# Patient Record
Sex: Female | Born: 1985 | Race: White | Hispanic: No | Marital: Married | Smoking: Former smoker
Health system: Southern US, Community
[De-identification: ages and names within clinical notes are randomized; demographics above are authoritative.]

## PROBLEM LIST (undated history)

## (undated) ENCOUNTER — Emergency Department (HOSPITAL_COMMUNITY): Disposition: A | Discharge status: Home / Self Care | Payer: Self-pay

## (undated) DIAGNOSIS — R87629 Unspecified abnormal cytological findings in specimens from vagina: Secondary | ICD-10-CM

## (undated) DIAGNOSIS — B999 Unspecified infectious disease: Secondary | ICD-10-CM

## (undated) DIAGNOSIS — Z789 Other specified health status: Secondary | ICD-10-CM

## (undated) HISTORY — PX: OTHER SURGICAL HISTORY: SHX169

## (undated) HISTORY — PX: NO PAST SURGERIES: SHX2092

## (undated) HISTORY — DX: Unspecified infectious disease: B99.9

## (undated) HISTORY — DX: Unspecified abnormal cytological findings in specimens from vagina: R87.629

---

## 2006-11-11 ENCOUNTER — Emergency Department (HOSPITAL_COMMUNITY): Admission: EM | Admit: 2006-11-11 | Discharge: 2006-11-11 | Payer: Self-pay | Admitting: Emergency Medicine

## 2009-04-28 ENCOUNTER — Emergency Department (HOSPITAL_COMMUNITY): Admission: EM | Admit: 2009-04-28 | Discharge: 2009-04-29 | Payer: Self-pay | Admitting: Emergency Medicine

## 2010-09-10 ENCOUNTER — Emergency Department (HOSPITAL_COMMUNITY): Admission: EM | Admit: 2010-09-10 | Discharge: 2010-09-10 | Payer: Self-pay | Admitting: Emergency Medicine

## 2011-10-01 ENCOUNTER — Inpatient Hospital Stay (HOSPITAL_COMMUNITY)
Admission: AD | Admit: 2011-10-01 | Discharge: 2011-10-01 | Disposition: A | Discharge status: Home / Self Care | Payer: Medicaid Other | Source: Ambulatory Visit | Attending: Family Medicine | Admitting: Family Medicine

## 2011-10-01 ENCOUNTER — Encounter (HOSPITAL_COMMUNITY): Payer: Self-pay

## 2011-10-01 DIAGNOSIS — N949 Unspecified condition associated with female genital organs and menstrual cycle: Secondary | ICD-10-CM | POA: Insufficient documentation

## 2011-10-01 DIAGNOSIS — A499 Bacterial infection, unspecified: Secondary | ICD-10-CM | POA: Insufficient documentation

## 2011-10-01 DIAGNOSIS — R109 Unspecified abdominal pain: Secondary | ICD-10-CM | POA: Insufficient documentation

## 2011-10-01 DIAGNOSIS — N760 Acute vaginitis: Secondary | ICD-10-CM

## 2011-10-01 DIAGNOSIS — N938 Other specified abnormal uterine and vaginal bleeding: Secondary | ICD-10-CM | POA: Insufficient documentation

## 2011-10-01 DIAGNOSIS — D696 Thrombocytopenia, unspecified: Secondary | ICD-10-CM | POA: Insufficient documentation

## 2011-10-01 DIAGNOSIS — B9689 Other specified bacterial agents as the cause of diseases classified elsewhere: Secondary | ICD-10-CM

## 2011-10-01 HISTORY — DX: Other specified health status: Z78.9

## 2011-10-01 LAB — URINALYSIS, ROUTINE W REFLEX MICROSCOPIC
Glucose, UA: NEGATIVE mg/dL
Ketones, ur: NEGATIVE mg/dL
Leukocytes, UA: NEGATIVE
Protein, ur: NEGATIVE mg/dL

## 2011-10-01 LAB — URINE MICROSCOPIC-ADD ON

## 2011-10-01 LAB — CBC
HCT: 41.7 % (ref 36.0–46.0)
MCHC: 32.1 g/dL (ref 30.0–36.0)
MCV: 89.1 fL (ref 78.0–100.0)
RDW: 13.5 % (ref 11.5–15.5)

## 2011-10-01 LAB — POCT PREGNANCY, URINE: Preg Test, Ur: NEGATIVE

## 2011-10-01 MED ORDER — NAPROXEN SODIUM 550 MG PO TABS: 550.0000 mg | ORAL_TABLET | Freq: Two times a day (BID) | ORAL | Status: DC

## 2011-10-01 MED ORDER — METRONIDAZOLE 500 MG PO TABS: 500.0000 mg | ORAL_TABLET | Freq: Two times a day (BID) | ORAL | Status: AC

## 2011-10-01 NOTE — ED Provider Notes (Signed)
Chart reviewed and agree with management and plan.  

## 2011-10-01 NOTE — ED Provider Notes (Signed)
History   Pt presents today c/o bilateral lower abd pain and cramping for the past week. She states she was on the Depo-provera injection for years but did not get her last injection in June of this year. Since that time she has had irregular vag spotting and pain. She reports the pain has worsened over the past week. Her last episode of intercourse was 2 days ago. She denies fever, vag irritation, or any other problems at this time.  Chief Complaint  Patient presents with  . Possible Pregnancy  . Abdominal Pain   HPI  OB History    Grav Para Term Preterm Abortions TAB SAB Ect Mult Living   0               History reviewed. No pertinent past medical history.  History reviewed. No pertinent past surgical history.  No family history on file.  History  Substance Use Topics  . Smoking status: Not on file  . Smokeless tobacco: Not on file  . Alcohol Use: Not on file    Allergies: Allergies not on file  No prescriptions prior to admission    Review of Systems  Constitutional: Negative for fever.  Cardiovascular: Negative for chest pain.  Gastrointestinal: Positive for abdominal pain. Negative for nausea, vomiting, diarrhea and constipation.  Genitourinary: Negative for dysuria, urgency, frequency and hematuria.  Neurological: Negative for dizziness and headaches.  Psychiatric/Behavioral: Negative for depression and suicidal ideas.   Physical Exam   Blood pressure 120/74, pulse 81, temperature 98.4 F (36.9 C), temperature source Oral, resp. rate 16, height 5\' 3"  (1.6 m), weight 89 lb 3.2 oz (40.461 kg), SpO2 98.00%.  Physical Exam  Nursing note and vitals reviewed. Constitutional: She is oriented to person, place, and time. She appears well-developed and well-nourished. No distress.  HENT:  Head: Normocephalic and atraumatic.  Eyes: EOM are normal. Pupils are equal, round, and reactive to light.  GI: Soft. She exhibits no distension. There is no tenderness. There is no  rebound and no guarding.  Genitourinary: There is bleeding around the vagina. Vaginal discharge found.       Cervix Lg/closed. Uterus NL size and shape. No adnexal masses.  Neurological: She is alert and oriented to person, place, and time.  Skin: Skin is warm and dry. She is not diaphoretic.  Psychiatric: She has a normal mood and affect. Her behavior is normal. Judgment and thought content normal.    MAU Course  Procedures  Wet prep and GC/Chlamydia cultures done.  Results for orders placed during the hospital encounter of 10/01/11 (from the past 24 hour(s))  URINALYSIS, ROUTINE W REFLEX MICROSCOPIC     Status: Abnormal   Collection Time   10/01/11  8:15 AM      Component Value Range   Color, Urine YELLOW  YELLOW    Appearance CLOUDY (*) CLEAR    Specific Gravity, Urine >1.030 (*) 1.005 - 1.030    pH 6.0  5.0 - 8.0    Glucose, UA NEGATIVE  NEGATIVE (mg/dL)   Hgb urine dipstick LARGE (*) NEGATIVE    Bilirubin Urine NEGATIVE  NEGATIVE    Ketones, ur NEGATIVE  NEGATIVE (mg/dL)   Protein, ur NEGATIVE  NEGATIVE (mg/dL)   Urobilinogen, UA 0.2  0.0 - 1.0 (mg/dL)   Nitrite NEGATIVE  NEGATIVE    Leukocytes, UA NEGATIVE  NEGATIVE   URINE MICROSCOPIC-ADD ON     Status: Abnormal   Collection Time   10/01/11  8:15 AM  Component Value Range   Squamous Epithelial / LPF MANY (*) RARE    RBC / HPF 7-10  <3 (RBC/hpf)   Bacteria, UA RARE  RARE   POCT PREGNANCY, URINE     Status: Normal   Collection Time   10/01/11  8:22 AM      Component Value Range   Preg Test, Ur NEGATIVE    CBC     Status: Abnormal   Collection Time   10/01/11  8:28 AM      Component Value Range   WBC 6.6  4.0 - 10.5 (K/uL)   RBC 4.68  3.87 - 5.11 (MIL/uL)   Hemoglobin 13.4  12.0 - 15.0 (g/dL)   HCT 96.0  45.4 - 09.8 (%)   MCV 89.1  78.0 - 100.0 (fL)   MCH 28.6  26.0 - 34.0 (pg)   MCHC 32.1  30.0 - 36.0 (g/dL)   RDW 11.9  14.7 - 82.9 (%)   Platelets 105 (*) 150 - 400 (K/uL)  WET PREP, GENITAL     Status:  Abnormal   Collection Time   10/01/11  8:32 AM      Component Value Range   Yeast, Wet Prep NONE SEEN  NONE SEEN    Trich, Wet Prep NONE SEEN  NONE SEEN    Clue Cells, Wet Prep MODERATE (*) NONE SEEN    WBC, Wet Prep HPF POC MODERATE (*) NONE SEEN      Assessment and Plan  BV: discussed with pt at length. Will tx with Flagyl. Warned of antabuse reaction.  Abd pain/irregular vag bleeding: advised pt to f/u with her PCP to start birth control. Will give Rx for anaprox DS. Discussed diet, activity, risks, and precautions.  Thrombocytopenia: discussed with pt at length. She needs to f/u with her PCP for further evaluation. Discussed importance of this at length. She understood and agreed.  Clinton Gallant. Sebastiana Wuest III, DrHSc, MPAS, PA-C  10/01/2011, 8:33 AM   Henrietta Hoover, PA 10/01/11 0909  Henrietta Hoover, PA 10/01/11 [phone]

## 2011-10-01 NOTE — Progress Notes (Signed)
Patient states she last had a Depo in March. Since that time she has had irregular bleeding. Started having abdominal pain about 1 1/2 weeks ago. Pain is in mid abdomen. Had a light pink discharge.

## 2011-10-02 LAB — GC/CHLAMYDIA PROBE AMP, GENITAL: Chlamydia, DNA Probe: NEGATIVE

## 2012-04-11 ENCOUNTER — Encounter (HOSPITAL_COMMUNITY): Payer: Self-pay

## 2012-04-11 ENCOUNTER — Emergency Department (HOSPITAL_COMMUNITY)
Admission: EM | Admit: 2012-04-11 | Discharge: 2012-04-11 | Disposition: A | Discharge status: Home / Self Care | Payer: Medicaid Other | Source: Home / Self Care | Attending: Family Medicine | Admitting: Family Medicine

## 2012-04-11 DIAGNOSIS — N949 Unspecified condition associated with female genital organs and menstrual cycle: Secondary | ICD-10-CM

## 2012-04-11 DIAGNOSIS — N938 Other specified abnormal uterine and vaginal bleeding: Secondary | ICD-10-CM

## 2012-04-11 DIAGNOSIS — N926 Irregular menstruation, unspecified: Secondary | ICD-10-CM

## 2012-04-11 MED ORDER — MEDROXYPROGESTERONE ACETATE 10 MG PO TABS: 10.0000 mg | ORAL_TABLET | Freq: Every day | ORAL | Status: DC

## 2012-04-11 NOTE — ED Provider Notes (Signed)
History     CSN: [id number]  Arrival date & time 04/11/12  1303   First MD Initiated Contact with Patient 04/11/12 1319      Chief Complaint  Patient presents with  . Medication Refill    (Consider location/radiation/quality/duration/timing/severity/associated sxs/prior treatment) Patient is a 26 y.o. female presenting with vaginal bleeding. The history is provided by the patient.  Vaginal Bleeding This is a chronic problem. The current episode started yesterday (stoppped depo provera last year and has menstrual problems since but controlled with provera). The problem has not changed since onset.Pertinent negatives include no abdominal pain.    Past Medical History  Diagnosis Date  . No pertinent past medical history     Past Surgical History  Procedure Date  . No past surgeries     History reviewed. No pertinent family history.  History  Substance Use Topics  . Smoking status: Current Everyday Smoker -- 0.2 packs/day  . Smokeless tobacco: Never Used  . Alcohol Use: No    OB History    Grav Para Term Preterm Abortions TAB SAB Ect Mult Living   0               Review of Systems  Constitutional: Negative.   Gastrointestinal: Negative.  Negative for abdominal pain.  Genitourinary: Positive for vaginal bleeding and menstrual problem. Negative for vaginal discharge and vaginal pain.    Allergies  Review of patient's allergies indicates no known allergies.  Home Medications   Current Outpatient Rx  Name Route Sig Dispense Refill  . MIDOL PO Oral Take 1 tablet by mouth daily as needed. Takes for pain     . MEDROXYPROGESTERONE ACETATE 10 MG PO TABS Oral Take 1 tablet (10 mg total) by mouth daily. After menses have continued 7 days 10 tablet 1  . MULTIVITAMINS PO CAPS Oral Take 1 capsule by mouth daily.      Marland Kitchen NAPROXEN SODIUM 550 MG PO TABS Oral Take 1 tablet (550 mg total) by mouth 2 (two) times daily with a meal. 30 tablet 0    BP 129/79  Pulse 84  Temp(Src)  98.8 F (37.1 C) (Oral)  Resp 18  SpO2 100%  LMP 04/10/2012  Physical Exam  Nursing note and vitals reviewed. Constitutional: She is oriented to person, place, and time. She appears well-developed and well-nourished.  Abdominal: Soft. Bowel sounds are normal. She exhibits no distension. There is no tenderness. There is no rebound and no guarding.  Neurological: She is alert and oriented to person, place, and time.  Skin: Skin is warm and dry.    ED Course  Procedures (including critical care time)   Labs Reviewed  POCT PREGNANCY, URINE   No results found.   1. DUB (dysfunctional uterine bleeding)       MDM          Linna Hoff, MD 04/11/12 1525

## 2012-04-11 NOTE — Discharge Instructions (Signed)
See your doctor if further problems. °

## 2012-04-11 NOTE — ED Notes (Signed)
Pt needs refill on provera, she states she started her period yesterday and has to take the provera after seven days to stop her period.

## 2012-08-15 ENCOUNTER — Inpatient Hospital Stay (HOSPITAL_COMMUNITY): Payer: Medicaid Other

## 2012-08-15 ENCOUNTER — Encounter (HOSPITAL_COMMUNITY): Payer: Self-pay | Admitting: *Deleted

## 2012-08-15 ENCOUNTER — Inpatient Hospital Stay (HOSPITAL_COMMUNITY)
Admission: AD | Admit: 2012-08-15 | Discharge: 2012-08-15 | Disposition: A | Discharge status: Home / Self Care | Payer: Medicaid Other | Source: Ambulatory Visit | Attending: Obstetrics & Gynecology | Admitting: Obstetrics & Gynecology

## 2012-08-15 DIAGNOSIS — B373 Candidiasis of vulva and vagina: Secondary | ICD-10-CM

## 2012-08-15 DIAGNOSIS — R109 Unspecified abdominal pain: Secondary | ICD-10-CM | POA: Insufficient documentation

## 2012-08-15 DIAGNOSIS — Z3490 Encounter for supervision of normal pregnancy, unspecified, unspecified trimester: Secondary | ICD-10-CM

## 2012-08-15 DIAGNOSIS — B3731 Acute candidiasis of vulva and vagina: Secondary | ICD-10-CM | POA: Insufficient documentation

## 2012-08-15 DIAGNOSIS — O2390 Unspecified genitourinary tract infection in pregnancy, unspecified trimester: Secondary | ICD-10-CM | POA: Insufficient documentation

## 2012-08-15 LAB — URINALYSIS, ROUTINE W REFLEX MICROSCOPIC
Bilirubin Urine: NEGATIVE
Hgb urine dipstick: NEGATIVE
Protein, ur: NEGATIVE mg/dL
Urobilinogen, UA: 0.2 mg/dL (ref 0.0–1.0)

## 2012-08-15 LAB — URINE MICROSCOPIC-ADD ON

## 2012-08-15 LAB — WET PREP, GENITAL: Trich, Wet Prep: NONE SEEN

## 2012-08-15 MED ORDER — MICONAZOLE NITRATE 2 % VA CREA: 1.0000 | TOPICAL_CREAM | Freq: Every day | VAGINAL | Status: DC

## 2012-08-15 MED ORDER — PRENATAL VITAMINS 28-0.8 MG PO TABS: 1.0000 | ORAL_TABLET | Freq: Every day | ORAL | Status: DC

## 2012-08-15 NOTE — MAU Provider Note (Signed)
Chief Complaint: Abdominal Pain and Shortness of Breath   First Provider Initiated Contact with Patient 08/15/12 0209     SUBJECTIVE HPI: Kathleen Romero is a 26 y.o. G1P0 at 10.4 weeks by unsure LMP who presents to maternity admissions reporting abdominal cramping and vaginal discharge.  She denies current SOB but indicates when she is having pain it is sometimes hard to catch her breath.  She has had 2 positive pregnancy tests at home and [facility]  Patient's last menstrual period was 05/31/2012.  She is unsure of this date.  She denies LOF, vaginal bleeding, vaginal itching/burning, urinary symptoms, h/a, dizziness, n/v, or fever/chills.     Past Medical History  Diagnosis Date  . No pertinent past medical history    Past Surgical History  Procedure Date  . No past surgeries    History   Social History  . Marital Status: Divorced    Spouse Name: N/A    Number of Children: N/A  . Years of Education: N/A   Occupational History  . Not on file.   Social History Main Topics  . Smoking status: Former Smoker -- 0.2 packs/day  . Smokeless tobacco: Never Used  . Alcohol Use: No  . Drug Use: No  . Sexually Active: Yes    Birth Control/ Protection: None   Other Topics Concern  . Not on file   Social History Narrative  . No narrative on file   No current facility-administered medications on file prior to encounter.   Current Outpatient Prescriptions on File Prior to Encounter  Medication Sig Dispense Refill  . Ibuprofen (MIDOL PO) Take 1 tablet by mouth daily as needed. Takes for pain      . medroxyPROGESTERone (PROVERA) 10 MG tablet Take 1 tablet (10 mg total) by mouth daily. After menses have continued 7 days  10 tablet  1  . Multiple Vitamin (MULTIVITAMIN) capsule Take 1 capsule by mouth daily.        . naproxen sodium (ANAPROX DS) 550 MG tablet Take 1 tablet (550 mg total) by mouth 2 (two) times daily with a meal.  30 tablet  0   No Known  Allergies  ROS: Pertinent items in HPI  OBJECTIVE Blood pressure 114/75, pulse 72, temperature 98.2 F (36.8 C), resp. rate 18, height 5\' 2"  (1.575 m), weight 42.094 kg (92 lb 12.8 oz), last menstrual period 05/31/2012, SpO2 100.00%. GENERAL: Well-developed, well-nourished female in no acute distress.  HEENT: Normocephalic HEART: normal rate RESP: normal effort, lung sounds clear bilaterally in all lobes  ABDOMEN: Soft, non-tender EXTREMITIES: Nontender, no edema NEURO: Alert and oriented Pelvic exam: Cervix pink, visually closed, without lesion, large amount white thick discharge with clumps adhering to vaginal walls and some thin yellow discharge in posterior fornix, vaginal walls and external genitalia normal Bimanual exam: Cervix 0/long/high, firm, posterior, neg CMT, uterus nontender, ~8 week size, adnexa without tenderness, enlargement, or mass  LAB RESULTS Results for orders placed during the hospital encounter of 08/15/12 (from the past 24 hour(s))  URINALYSIS, ROUTINE W REFLEX MICROSCOPIC     Status: Abnormal   Collection Time   08/15/12 12:35 AM      Component Value Range   Color, Urine YELLOW  YELLOW   APPearance CLEAR  CLEAR   Specific Gravity, Urine 1.015  1.005 - 1.030   pH 7.0  5.0 - 8.0   Glucose, UA NEGATIVE  NEGATIVE mg/dL   Hgb urine dipstick NEGATIVE  NEGATIVE   Bilirubin  Urine NEGATIVE  NEGATIVE   Ketones, ur NEGATIVE  NEGATIVE mg/dL   Protein, ur NEGATIVE  NEGATIVE mg/dL   Urobilinogen, UA 0.2  0.0 - 1.0 mg/dL   Nitrite NEGATIVE  NEGATIVE   Leukocytes, UA MODERATE (*) NEGATIVE  URINE MICROSCOPIC-ADD ON     Status: Abnormal   Collection Time   08/15/12 12:35 AM      Component Value Range   Squamous Epithelial / LPF FEW (*) RARE   WBC, UA 3-6  <3 WBC/hpf   RBC / HPF 0-2  <3 RBC/hpf   Bacteria, UA FEW (*) RARE  POCT PREGNANCY, URINE     Status: Abnormal   Collection Time   08/15/12 12:40 AM      Component Value Range   Preg Test, Ur POSITIVE (*) NEGATIVE   WET PREP, GENITAL     Status: Abnormal   Collection Time   08/15/12  2:07 AM      Component Value Range   Yeast Wet Prep HPF POC FEW (*) NONE SEEN   Trich, Wet Prep NONE SEEN  NONE SEEN   Clue Cells Wet Prep HPF POC MANY (*) NONE SEEN   WBC, Wet Prep HPF POC MANY (*) NONE SEEN    IMAGING US Ob Comp Less 14 Wks  08/15/2012  *RADIOLOGY REPORT*  Clinical Data: First trimester pregnancy with pain.  OBSTETRIC <14 WK ULTRASOUND  Technique:  Transabdominal ultrasound was performed for evaluation of the gestation as well as the maternal uterus and adnexal regions.  Comparison:  None.  Intrauterine gestational sac: Visualized/normal in shape. Yolk sac: Present Embryo: Present Cardiac Activity: Present Heart Rate: 170 bpm CRL:  21 mm  8 w  5 d         [place] EDC: 03/22/2013  Maternal uterus/Adnexae: Normal appearance of left ovary measuring 4.1 x 2.4 x 2.4 cm. Normal appearance of the right ovary measuring 3.4 x 2.0 x 2.5 cm  IMPRESSION: Live single intrauterine pregnancy.  Gestational age by first trimester ultrasound is 8 weeks and 5 days.   Original Report Authenticated By: Richarda Overlie, M.D.     ASSESSMENT 1. Normal IUP (intrauterine pregnancy) on prenatal ultrasound   2. Vaginal candida    IUP [redacted]w[redacted]d by U/S  PLAN Discharge home F/U with early prenatal care Pregnancy verification letter and list of providers given Prenatal vitamin and Monistat 7 prescribed Return to MAU as needed    Medication List     As of 08/15/2012  8:48 AM    STOP taking these medications         medroxyPROGESTERone 10 MG tablet   Commonly known as: PROVERA      MIDOL PO      multivitamin capsule      naproxen sodium 550 MG tablet   Commonly known as: ANAPROX      TAKE these medications         miconazole 2 % vaginal cream   Commonly known as: MONISTAT 7   Place 7 Applicatorfuls vaginally at bedtime.      Prenatal Vitamins 28-0.8 MG Tabs   Take 1 tablet by mouth daily.           Sharen Counter Certified Nurse-Midwife 08/15/2012  2:44 AM

## 2012-08-15 NOTE — MAU Note (Signed)
Been having some pain in mid L abdomen. Kathleen Romero and comes and goes. Sometimes when pain hits I have alittle shorteness of breath.

## 2012-08-15 NOTE — Progress Notes (Signed)
Sharen Counter CNM in earlier and discussed lab results and d/c plan with pt. Written and verbal d/c instructions given and understanding voiced.

## 2012-08-16 LAB — URINE CULTURE: Colony Count: NO GROWTH

## 2012-08-17 LAB — GC/CHLAMYDIA PROBE AMP, GENITAL: GC Probe Amp, Genital: NEGATIVE

## 2012-09-01 ENCOUNTER — Ambulatory Visit (INDEPENDENT_AMBULATORY_CARE_PROVIDER_SITE_OTHER): Payer: Self-pay | Admitting: Obstetrics and Gynecology

## 2012-09-01 DIAGNOSIS — Z331 Pregnant state, incidental: Secondary | ICD-10-CM

## 2012-09-02 DIAGNOSIS — Z331 Pregnant state, incidental: Secondary | ICD-10-CM | POA: Insufficient documentation

## 2012-09-02 LAB — PRENATAL PANEL VII
Basophils Relative: 0 % (ref 0–1)
Eosinophils Absolute: 0.2 10*3/uL (ref 0.0–0.7)
HCT: 34 % — ABNORMAL LOW (ref 36.0–46.0)
Hemoglobin: 11.2 g/dL — ABNORMAL LOW (ref 12.0–15.0)
MCH: 28.2 pg (ref 26.0–34.0)
MCHC: 32.9 g/dL (ref 30.0–36.0)
MCV: 85.6 fL (ref 78.0–100.0)
Monocytes Absolute: 0.7 10*3/uL (ref 0.1–1.0)
Monocytes Relative: 7 % (ref 3–12)
Rh Type: POSITIVE
Rubella: 51.9 IU/mL — ABNORMAL HIGH

## 2012-09-03 LAB — CULTURE, OB URINE
Colony Count: NO GROWTH
Organism ID, Bacteria: NO GROWTH

## 2012-09-07 ENCOUNTER — Encounter: Payer: Self-pay | Admitting: Obstetrics and Gynecology

## 2012-09-07 ENCOUNTER — Ambulatory Visit (INDEPENDENT_AMBULATORY_CARE_PROVIDER_SITE_OTHER): Payer: Self-pay | Admitting: Obstetrics and Gynecology

## 2012-09-07 VITALS — BP 98/62 | Wt 95.0 lb

## 2012-09-07 DIAGNOSIS — Z331 Pregnant state, incidental: Secondary | ICD-10-CM

## 2012-09-07 DIAGNOSIS — Z8279 Family history of other congenital malformations, deformations and chromosomal abnormalities: Secondary | ICD-10-CM

## 2012-09-07 NOTE — Progress Notes (Signed)
NOB work-up  Pt stated been having a light brown discharge. Pt stated no issues today.

## 2012-09-08 ENCOUNTER — Telehealth: Payer: Self-pay | Admitting: Obstetrics and Gynecology

## 2012-09-08 DIAGNOSIS — Z8279 Family history of other congenital malformations, deformations and chromosomal abnormalities: Secondary | ICD-10-CM | POA: Insufficient documentation

## 2012-09-08 NOTE — Progress Notes (Signed)
Patient ID: Tonika Eden, female   DOB: 11/04/1986, 26 y.o.   MRN: [id number] Gwynneth Fabio is a 26 y.o. female presenting for new ob visit. Not on Birth control at time of conception, dating by 8 5/. Week [place] on 9/26 for [facility] 03/22/2013 discussed. Without N,V, is tolerating PNV. @MED  @IPILAPH @ OB History    Grav Para Term Preterm Abortions TAB SAB Ect Mult Living   1             SVD uncomplicated Past Medical History  Diagnosis Date  . No pertinent past medical history   . Infection     Yeast;was treated 08/15/12 w/ Monistat 7  . Infection     BV x 2   Past Surgical History  Procedure Date  . No past surgeries    Family History: family history includes Alcohol abuse in her father; Asthma in her paternal uncle; Hypertension in her father and mother; and Other in her other. Social History:  reports that she quit smoking about 7 weeks ago. She has never used smokeless tobacco. She reports that she does not drink alcohol or use illicit drugs. Father of baby with congenital heart defect Fm hx of Huntington Clorea MGF, M uncle, M aunt @ROS @    Blood pressure 98/62, weight 95 lb (43.092 kg), last menstrual period 05/31/2012. Physical exam: Calm, no distress, HEENT wnl lungs clear bilaterally, breasts bilaterally no masses, dimpling, or drainage, AP RRR, abd soft, gravid, nt, bowel sounds active, abdomen nontender,  Normal hair distrubition mons pubis,  EGBUS WNL, sterile speculum exam,  vagina pink, moist normal rugae,  cerix LTC, no cervical motion tenderness, No adnexal masses or tenderness Uterus size 12 Scant white discharge DTR +1 no clonus No edema to lower extremities  Prenatal labs: ABO, Rh: O/POS/-- (10/15 1404) Antibody: NEG (10/15 1404) Rubella:  Immune RPR: NON REAC (10/15 1404)  HBsAg: NEGATIVE (10/15 1404)  HIV: NON REACTIVE (10/15 1404)    Assessment/Plan: 12w FOB with congenital heart defect GC/CHL neg/neg WET PREP neg PAP less than 1 year ago with  request records ULTRASOUND plan 20 week anatomy Genetic testing (with fm hx of Huntington Clorea [facility] or amnocentesis testing declines) but  plans QUAD Collaboration with Dr. Estanislado Pandy. [facility], Kashayla Ungerer 09/08/2012, 3:41 PM Lavera Guise, CNM

## 2012-09-08 NOTE — Telephone Encounter (Signed)
Left message to call office with information regarding father of baby congenital heart defect Kathleen Romero, CNM

## 2012-09-09 ENCOUNTER — Encounter: Payer: Self-pay | Admitting: Obstetrics and Gynecology

## 2012-09-09 DIAGNOSIS — Z820 Family history of epilepsy and other diseases of the nervous system: Secondary | ICD-10-CM | POA: Insufficient documentation

## 2012-09-22 ENCOUNTER — Telehealth: Payer: Self-pay | Admitting: Obstetrics and Gynecology

## 2012-09-22 ENCOUNTER — Other Ambulatory Visit: Payer: Self-pay | Admitting: Obstetrics and Gynecology
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
## 2012-10-03 ENCOUNTER — Encounter: Payer: Self-pay | Admitting: Obstetrics and Gynecology

## 2012-10-03 DIAGNOSIS — IMO0002 Reserved for concepts with insufficient information to code with codable children: Secondary | ICD-10-CM | POA: Insufficient documentation

## 2012-10-05 ENCOUNTER — Ambulatory Visit (INDEPENDENT_AMBULATORY_CARE_PROVIDER_SITE_OTHER): Payer: Medicaid Other | Admitting: Obstetrics and Gynecology

## 2012-10-05 VITALS — BP 104/56 | Wt 98.0 lb

## 2012-10-05 DIAGNOSIS — Z331 Pregnant state, incidental: Secondary | ICD-10-CM

## 2012-10-05 DIAGNOSIS — Z3689 Encounter for other specified antenatal screening: Secondary | ICD-10-CM

## 2012-10-05 DIAGNOSIS — Z23 Encounter for immunization: Secondary | ICD-10-CM

## 2012-10-05 NOTE — Progress Notes (Signed)
[redacted]w[redacted]d.  Flu vaccine. Pt voices no complaints today.  Prenatal labs reviewed QUAD SCREEN TODAY Anatomy U/S [place] Long hx of thrombocytopenia and easy bleeding earlier in life.  Hematology consult. Check at [place] to see if pap result with date has been sent from [facility]

## 2012-10-06 ENCOUNTER — Other Ambulatory Visit: Payer: Self-pay

## 2012-10-06 DIAGNOSIS — Z862 Personal history of diseases of the blood and blood-forming organs and certain disorders involving the immune mechanism: Secondary | ICD-10-CM

## 2012-10-06 LAB — AFP, QUAD SCREEN
Age Alone: 1:1030 {titer}
Curr Gest Age: 16 wks.days
INH: 230.6 pg/mL
MoM for AFP: 1.3
Osb Risk: 1:4990 {titer}
Trisomy 18 (Edward) Syndrome Interp.: <1:86500 {titer}
uE3 Value: 0.8 ng/mL

## 2012-10-07 ENCOUNTER — Telehealth: Payer: Self-pay | Admitting: Hematology and Oncology

## 2012-10-07 NOTE — Telephone Encounter (Signed)
C/D 10/07/12 for appt.10/22/12

## 2012-10-07 NOTE — Telephone Encounter (Signed)
S/W pt in NP appt 12/05 @ 9:30 w/Dr. Dalene Carrow.  Referring Dr. Pennie Rushing Dx-H/O Thrombocytopenia.  Welcome packet mailed.

## 2012-10-22 ENCOUNTER — Ambulatory Visit (HOSPITAL_BASED_OUTPATIENT_CLINIC_OR_DEPARTMENT_OTHER): Payer: Medicaid Other | Admitting: Hematology and Oncology

## 2012-10-22 ENCOUNTER — Encounter: Payer: Self-pay | Admitting: Hematology and Oncology

## 2012-10-22 ENCOUNTER — Ambulatory Visit (HOSPITAL_BASED_OUTPATIENT_CLINIC_OR_DEPARTMENT_OTHER): Payer: Medicaid Other | Admitting: Lab

## 2012-10-22 ENCOUNTER — Ambulatory Visit: Payer: Medicaid Other

## 2012-10-22 ENCOUNTER — Telehealth: Payer: Self-pay | Admitting: Hematology and Oncology

## 2012-10-22 VITALS — BP 121/66 | HR 104 | Temp 98.6°F | Resp 20 | Ht 62.0 in | Wt 101.1 lb

## 2012-10-22 DIAGNOSIS — D689 Coagulation defect, unspecified: Secondary | ICD-10-CM

## 2012-10-22 DIAGNOSIS — D696 Thrombocytopenia, unspecified: Secondary | ICD-10-CM

## 2012-10-22 DIAGNOSIS — O99119 Other diseases of the blood and blood-forming organs and certain disorders involving the immune mechanism complicating pregnancy, unspecified trimester: Secondary | ICD-10-CM

## 2012-10-22 DIAGNOSIS — R58 Hemorrhage, not elsewhere classified: Secondary | ICD-10-CM

## 2012-10-22 LAB — CBC WITH DIFFERENTIAL/PLATELET
BASO%: 0.2 % (ref 0.0–2.0)
Eosinophils Absolute: 0.2 10*3/uL (ref 0.0–0.5)
LYMPH%: 14.7 % (ref 14.0–49.7)
MCHC: 33.7 g/dL (ref 31.5–36.0)
MONO#: 0.9 10*3/uL (ref 0.1–0.9)
NEUT#: 7.6 10*3/uL — ABNORMAL HIGH (ref 1.5–6.5)
Platelets: 134 10*3/uL — ABNORMAL LOW (ref 145–400)
RBC: 3.91 10*6/uL (ref 3.70–5.45)
WBC: 10.2 10*3/uL (ref 3.9–10.3)
lymph#: 1.5 10*3/uL (ref 0.9–3.3)

## 2012-10-22 LAB — COMPREHENSIVE METABOLIC PANEL (CC13)
ALT: 53 U/L (ref 0–55)
Albumin: 3 g/dL — ABNORMAL LOW (ref 3.5–5.0)
CO2: 26 mEq/L (ref 22–29)
Calcium: 8.7 mg/dL (ref 8.4–10.4)
Chloride: 108 mEq/L — ABNORMAL HIGH (ref 98–107)
Glucose: 59 mg/dl — ABNORMAL LOW (ref 70–99)
Potassium: 3.8 mEq/L (ref 3.5–5.1)
Sodium: 139 mEq/L (ref 136–145)
Total Bilirubin: 0.23 mg/dL (ref 0.20–1.20)
Total Protein: 6.1 g/dL — ABNORMAL LOW (ref 6.4–8.3)

## 2012-10-22 NOTE — Patient Instructions (Addendum)
Everitt Amber  [id number]   [facility] - AFTER VISIT SUMMARY   **RECOMMENDATIONS MADE BY THE CONSULTANT AND ANY TEST    RESULTS WILL BE SENT TO YOUR REFERRING DOCTORS.   YOUR EXAM FINDINGS, LABS AND RESULTS WERE DISCUSSED BY YOUR MD TODAY.  YOU CAN GO TO THE [facility] WEB SITE FOR INSTRUCTIONS ON HOW TO ASSESS MY CHART FOR ADDITIONAL INFORMATION AS NEEDED.  Your Updated drug allergies are: Allergies as of 10/22/2012  . (No Known Allergies)    Your current list of medications are: Current Outpatient Prescriptions  Medication Sig Dispense Refill  . Prenatal Vit-Fe Fumarate-FA (PRENATAL VITAMINS) 28-0.8 MG TABS Take 2 tablets by mouth daily.         INSTRUCTIONS GIVEN AND DISCUSSED:  See attached schedule   SPECIAL INSTRUCTIONS/FOLLOW-UP:  See above.  I acknowledge that I have been informed and understand all the instructions given to me and received a copy.I know to contact the clinic, my physician, or go to the emergency Department if any problems should occur.   I do not have any more questions at this time, but understand that I may call the [facility] at [phone] during business hours should I have any further questions or need assistance in obtaining follow-up care.

## 2012-10-22 NOTE — Telephone Encounter (Signed)
appts made and printed for pt aom °

## 2012-10-22 NOTE — Progress Notes (Signed)
NO   Primary. Goes  To   [facility]   OB/GYN.  [facility]    Pharmacy   On   [address] / [address] [facility]    Pharmacy   On   [address]  Dr. Rushie Chestnut    Pharmacy   On   [address] / [address]  Cell   Phone     [phone].

## 2012-10-22 NOTE — Progress Notes (Signed)
This office note has been dictated.

## 2012-10-23 NOTE — Progress Notes (Signed)
CC:   Hal Morales, M.D.  IDENTIFYING STATEMENT:  The patient is a 26 year old woman seen at request of Dr. Pennie Rushing with thrombocytopenia.  HISTORY OF PRESENT ILLNESS:  The patient is [redacted] weeks pregnant.  This is her first pregnancy.  She is here because she has a history for thrombocytopenia.  She reports that she has been thrombocytopenic for most of her life.  Her father also has thrombocytopenia.  He was never treated but passed away from "unknown illness".  The patient reports that she bruises quite easily.  She also reports that she is a "bleeder".  She has had a number of nose bleeds when younger.  She also gives a history of heavy periods.  She is not double jointed.  Since being pregnant she notes that bruising has lessened.  Review of blood results have noted the following:  On 09/01/2010 white cell count 9.8, hemoglobin 11.2, hematocrit 34, platelets 135; a year ago white cell count 6.6, hemoglobin 13.4, hematocrit 41.7, platelets 105.  PAST MEDICAL HISTORY:  Nil.  SOCIAL HISTORY:  The patient is single.  This is her first child.  She reports she has smoked a quarter pack per day for 12 years.  She denies alcohol use.  She is a Administrator at [employer].  FAMILY HISTORY:  She reports her father as having thrombocytopenia. There is also family history for Huntington's chorea in a maternal grandfather, maternal uncle and maternal aunt.  MEDICATIONS:  Prenatal vitamins.  ALLERGIES:  None.  REVIEW OF SYSTEMS:  A 15 point review of systems essentially negative except for notes some bruising.  PHYSICAL EXAM:  General:  The patient is alert and oriented x3.  Vitals: Pulse 104, blood pressure 121/66, temperature 98.6, respirations 20, weight 101 pounds.  HEENT:  Head is atraumatic, normocephalic.  Sclerae anicteric.  Mouth moist.  Chest:  Clear to percussion and auscultation. CVS:  First and second heart sounds present.  No added sounds or murmurs.   Abdomen:  Soft, nontender.  No masses.  Bowel sounds present. Extremities:  No edema.  Pulses present.  IMPRESSION AND PLAN:  [name] is a 26 year old woman with thrombocytopenia who is [redacted] weeks pregnant.  The patient has family history for it and reports history of easy bruising and menorrhagia prior to her pregnancy.  Her previous blood counts a month and a half ago reports a platelet count of 135 which is really within the low limits of normal.  I will have her return to the lab to repeat a CBC. If her counts are lower than 80,000 we may give some steroids but before we do that I would like for her to have a von Willebrand's panel performed.  Will review morphology.  Will check vitamin B12 level, thyroid function tests and platelet functional studies.  We spent more than half the time discussing implications of thrombocytopenia with pregnancy specifically at time of delivery. She had a number of questions that were all answered.  As soon as we get lab results she will follow up and additional recommendations will follow.    ______________________________ Laurice Record, M.D. LIO/MEDQ  D:  10/22/2012  T:  10/23/2012  Job:  [id number]

## 2012-10-27 LAB — VON WILLEBRAND FACTOR MULTIMER
Factor-VIII Activity: 142 % (ref 50–180)
Von Willebrand Factor Ag: 102 % (ref 50–217)

## 2012-10-27 LAB — VITAMIN B12: Vitamin B-12: 537 pg/mL (ref 211–911)

## 2012-11-02 ENCOUNTER — Encounter: Payer: Medicaid Other | Admitting: Obstetrics and Gynecology

## 2012-11-02 ENCOUNTER — Other Ambulatory Visit: Payer: Medicaid Other

## 2012-11-03 ENCOUNTER — Telehealth: Payer: Self-pay | Admitting: Obstetrics and Gynecology

## 2012-11-04 ENCOUNTER — Encounter: Payer: Self-pay | Admitting: Obstetrics and Gynecology

## 2012-11-04 ENCOUNTER — Ambulatory Visit (INDEPENDENT_AMBULATORY_CARE_PROVIDER_SITE_OTHER): Payer: Medicaid Other

## 2012-11-04 ENCOUNTER — Ambulatory Visit (INDEPENDENT_AMBULATORY_CARE_PROVIDER_SITE_OTHER): Payer: Medicaid Other | Admitting: Obstetrics and Gynecology

## 2012-11-04 VITALS — BP 96/60 | Wt 107.0 lb

## 2012-11-04 DIAGNOSIS — Z3689 Encounter for other specified antenatal screening: Secondary | ICD-10-CM

## 2012-11-04 DIAGNOSIS — Z331 Pregnant state, incidental: Secondary | ICD-10-CM

## 2012-11-04 NOTE — Progress Notes (Signed)
[redacted]w[redacted]d Ultrasound: Single gestation, normal anatomy, female, normal fluid, cervix 3.1 cm, 354 g (53rd percentile). Quad screen normal Return to office in 4 weeks. Dr. Stefano Gaul

## 2012-11-04 NOTE — Progress Notes (Signed)
[redacted]w[redacted]d PT HERE TO F/U ANATOMY SCAN. NO PROBLEMS.

## 2012-11-06 LAB — US OB COMP + 14 WK

## 2012-11-07 ENCOUNTER — Telehealth: Payer: Self-pay | Admitting: Oncology

## 2012-11-07 NOTE — Telephone Encounter (Signed)
lm to call regarding r/s appt(went ahead and r/s appt to [facility] 11/30/12)    anne

## 2012-11-18 NOTE — L&D Delivery Note (Signed)
Delivery Note At 4:18 PM a viable female was delivered via Vaginal, Spontaneous Delivery (Presentation: Right Occiput Anterior).  APGAR: 9, 9; weight pending.   Placenta status: Intact, Spontaneous 3 VC. Cord pH: not collected  Complications: After delivery I was called to [facility] for heavy bleeding.  Initial assessment: fundus firm with continuous trickle.  Dr. Normand Sloop called to come in.  Speculum exam done to assess for lacs. PPH protocol initiated.  Pitocin bolus continued and 0.2 mg Methergine IM given.  Dr. Normand Sloop at [facility] - see her note for further details.  Anesthesia: Epidural  Episiotomy: None Lacerations: Left and right labial lac hemodynamically stable no repair Suture Repair: n/a Est. Blood Loss (mL): 300  Total blood loss after PPH 1500 mL  Mom  stable, will reassess bleeding before admiting to [facility].  Baby in room with mom and pt's mom.  Haroldine Laws 03/06/2013, 5:26 PM

## 2012-11-21 ENCOUNTER — Telehealth: Payer: Self-pay | Admitting: Oncology

## 2012-11-21 ENCOUNTER — Encounter: Payer: Self-pay | Admitting: Oncology

## 2012-11-21 NOTE — Telephone Encounter (Signed)
Former pt of LO reassigned to DM. Still not able to reach pt. Letter will be sent.

## 2012-11-24 ENCOUNTER — Telehealth: Payer: Self-pay | Admitting: Obstetrics and Gynecology

## 2012-11-24 NOTE — Telephone Encounter (Signed)
Tc to pt per telephone call. Informed pt may try otc plain Sudafed. Denies fever. Pt voices understanding.

## 2012-11-30 ENCOUNTER — Ambulatory Visit: Payer: Medicaid Other | Admitting: Family

## 2012-12-01 ENCOUNTER — Ambulatory Visit: Payer: Medicaid Other | Admitting: Hematology and Oncology

## 2012-12-02 ENCOUNTER — Encounter: Payer: Medicaid Other | Admitting: Obstetrics and Gynecology

## 2012-12-07 ENCOUNTER — Encounter: Payer: Self-pay | Admitting: Obstetrics and Gynecology

## 2012-12-07 ENCOUNTER — Ambulatory Visit: Payer: Medicaid Other | Admitting: Obstetrics and Gynecology

## 2012-12-07 VITALS — BP 100/62 | Wt 113.0 lb

## 2012-12-07 DIAGNOSIS — Z331 Pregnant state, incidental: Secondary | ICD-10-CM

## 2012-12-07 NOTE — Progress Notes (Signed)
[redacted]w[redacted]d Scheduled fetal echo glc @[facility] 

## 2012-12-11 ENCOUNTER — Ambulatory Visit (HOSPITAL_BASED_OUTPATIENT_CLINIC_OR_DEPARTMENT_OTHER): Payer: Medicaid Other | Admitting: Lab

## 2012-12-11 ENCOUNTER — Telehealth: Payer: Self-pay | Admitting: Oncology

## 2012-12-11 ENCOUNTER — Encounter: Payer: Self-pay | Admitting: Family

## 2012-12-11 ENCOUNTER — Ambulatory Visit (HOSPITAL_BASED_OUTPATIENT_CLINIC_OR_DEPARTMENT_OTHER): Payer: Medicaid Other | Admitting: Family

## 2012-12-11 VITALS — BP 123/69 | HR 102 | Temp 98.2°F | Resp 18 | Ht 62.0 in | Wt 113.0 lb

## 2012-12-11 DIAGNOSIS — D689 Coagulation defect, unspecified: Secondary | ICD-10-CM

## 2012-12-11 DIAGNOSIS — D696 Thrombocytopenia, unspecified: Secondary | ICD-10-CM

## 2012-12-11 LAB — CBC WITH DIFFERENTIAL/PLATELET
BASO%: 0.2 % (ref 0.0–2.0)
Basophils Absolute: 0 10*3/uL (ref 0.0–0.1)
EOS%: 2.1 % (ref 0.0–7.0)
Eosinophils Absolute: 0.3 10*3/uL (ref 0.0–0.5)
HCT: 33.4 % — ABNORMAL LOW (ref 34.8–46.6)
HGB: 11.4 g/dL — ABNORMAL LOW (ref 11.6–15.9)
LYMPH%: 15 % (ref 14.0–49.7)
MCH: 30 pg (ref 25.1–34.0)
MCHC: 34.1 g/dL (ref 31.5–36.0)
MCV: 87.9 fL (ref 79.5–101.0)
MONO#: 1.2 10*3/uL — ABNORMAL HIGH (ref 0.1–0.9)
MONO%: 9 % (ref 0.0–14.0)
NEUT#: 9.6 10*3/uL — ABNORMAL HIGH (ref 1.5–6.5)
NEUT%: 73.7 % (ref 38.4–76.8)
Platelets: 127 10*3/uL — ABNORMAL LOW (ref 145–400)
RBC: 3.8 10*6/uL (ref 3.70–5.45)
RDW: 14.3 % (ref 11.2–14.5)
WBC: 13 10*3/uL — ABNORMAL HIGH (ref 3.9–10.3)
lymph#: 1.9 10*3/uL (ref 0.9–3.3)

## 2012-12-11 NOTE — Telephone Encounter (Signed)
gv and printed appt schedule for Feb and March ....the patient aware °

## 2012-12-11 NOTE — Patient Instructions (Addendum)
Please contact us at [phone] if you have any questions or concerns.  Call us if you experience any excessive bleeding.

## 2012-12-11 NOTE — Progress Notes (Signed)
Patient ID: Kathleen Romero, female   DOB: [birth year], 27 y.o.   MRN: [id number] CSN: [id number]  CC: Hal Morales, M.D.  Problem List: Kathleen Romero is a 28 y.o. Caucasian female with a problem list consisting of:  1.  Thrombocytopenia with low platelets documented in EPIC on 10/01/2011 at 105,000. The patient states that she has a history of bleeding/bruising easily, epistaxis and menorrhagia.  Normal Von Willebrand on 2012/10/23.  Her father (deceased) had a history of thrombocytopenia.  Family history of Huntington's Chorea. 2.  Pregnant with her first pregnancy with an expected due date of 03/22/2013.  Dr. Arline Asp and I saw Kathleen Romero today for follow up of thrombocytopenia.   Kathleen Romero is transitioning her care from Dr. Dalene Carrow to Dr. Arline Asp today. Kathleen Romero was last seen by Dr. Dalene Carrow on 2012/10/23. Since her last office visit, Kathleen Romero states that she been doing well and does not have any complaints today. She denies any symptomatology during today's visit including any unusual bleeding, bruising, night sweats, fever, chills, N/V/D, headaches or constipation. Kathleen Romero is physically active and has a good appetite. She works part-time at [employer].  Past Medical History: Past Medical History  Diagnosis Date  . No pertinent past medical history   . Infection     Yeast;was treated 08/15/12 w/ Monistat 7  . Infection     BV x 2    Surgical History: Past Surgical History  Procedure Date  . No past surgeries     Current Medications: Current Outpatient Prescriptions  Medication Sig Dispense Refill  . Prenatal Vit-Fe Fumarate-FA (PRENATAL VITAMINS) 28-0.8 MG TABS Take 2 tablets by mouth daily.        Allergies: No Known Allergies  Family History: Family History  Problem Relation Age of Onset  . Hypertension Mother   . Hypertension Father     deceased  . Other Other     Huntington's Chorea;MGF, M. Uncle(deceased);M. Aunt (showing signs of)  .  Asthma Paternal Uncle     x 2  . Alcohol abuse Father     Social History: History  Substance Use Topics  . Smoking status: Former Smoker -- 0.2 packs/day for 12 years    Types: Cigarettes, Cigars    Quit date: 07/20/2012  . Smokeless tobacco: Never Used  . Alcohol Use: No     Comment: Rarely    Review of Systems: 10 Point review of systems was completed and is negative.  Physical Exam:   Blood pressure 123/69, pulse 102, temperature 98.2 F (36.8 C), temperature source Oral, resp. rate 18, height 5\' 2"  (1.575 m), weight 113 lb (51.256 kg), last menstrual period 05/31/2012.  General appearance: Alert, cooperative, well nourished, no apparent distress Head: Normocephalic, without obvious abnormality, atraumatic Eyes: Conjunctivae/corneas clear, PERRLA, EOMI Nose: Nares, septum and mucosa are normal, no drainage or sinus tenderness Neck: No adenopathy, supple, symmetrical, trachea midline, thyroid not enlarged, no tenderness Resp: Clear to auscultation bilaterally Cardio: Regular rate and rhythm, S1, S2 normal, no murmur, click, rub or gallop GI:  [redacted] weeks pregnant, hypoactive bowel sounds Extremities: Extremities normal, atraumatic, no cyanosis or edema Lymph nodes: Cervical, supraclavicular, and axillary nodes normal Neurologic: Grossly normal   Laboratory Data: Recent Results (from the past 168 hour(s))  CBC WITH DIFFERENTIAL   Collection Time   12/11/12  2:15 PM      Component Value Range   WBC 13.0 (*) 3.9 - 10.3 10e3/uL   NEUT# 9.6 (*) 1.5 - 6.5 10e3/uL  HGB 11.4 (*) 11.6 - 15.9 g/dL   HCT 13.0 (*) 86.5 - 78.4 %   Platelets 127 (*) 145 - 400 10e3/uL   MCV 87.9  79.5 - 101.0 fL   MCH 30.0  25.1 - 34.0 pg   MCHC 34.1  31.5 - 36.0 g/dL   RBC 6.96  2.95 - 2.84 10e6/uL   RDW 14.3  11.2 - 14.5 %   lymph# 1.9  0.9 - 3.3 10e3/uL   MONO# 1.2 (*) 0.1 - 0.9 10e3/uL   Eosinophils Absolute 0.3  0.0 - 0.5 10e3/uL   Basophils Absolute 0.0  0.0 - 0.1 10e3/uL   NEUT% 73.7   38.4 - 76.8 %   LYMPH% 15.0  14.0 - 49.7 %   MONO% 9.0  0.0 - 14.0 %   EOS% 2.1  0.0 - 7.0 %   BASO% 0.2  0.0 - 2.0 %   Imaging Studies: 1.  CT sinuses limited without contrast on 04/29/2009 showed chronic sinus disease. 2.  US OB Complete less than 14 weeks showed live single intrauterine pregnancy. Gestational age by first trimester ultrasound is 8 weeks and 5 days.    Impression/Plan: Kathleen Romero is a 27 year-old Caucasian female with thrombocytopenia who is [redacted] weeks pregnant. Her platelet counts have ranged from 105,000 on 10/01/2011 to 135,000 on 09/01/2012 to 127,000 today.  If her platelets decrease, treatment with steroids has been discussed with her by Dr. Dalene Carrow and Dr. Arline Asp.  We plan to see Kathleen Romero in 2 months on 02/08/2013 at which time we will check a CBC, CMP and LDH.  Kathleen Romero was encouraged to contact us in the interim if she has any questions, concerns or experiences any bleeding.  Extensive records review was completed in preparation for the transitioning of Kathleen Romero's care to our service. Thirty minutes was spent face to face with this patient.   Larina Bras, NP-C 12/11/2012, 4:33 PM

## 2012-12-28 ENCOUNTER — Encounter: Payer: Medicaid Other | Admitting: Obstetrics and Gynecology

## 2012-12-28 ENCOUNTER — Other Ambulatory Visit: Payer: Medicaid Other

## 2012-12-28 ENCOUNTER — Other Ambulatory Visit: Payer: Medicaid Other | Admitting: Obstetrics and Gynecology

## 2012-12-29 ENCOUNTER — Ambulatory Visit: Payer: Medicaid Other | Admitting: Obstetrics and Gynecology

## 2012-12-29 ENCOUNTER — Other Ambulatory Visit: Payer: Medicaid Other

## 2012-12-29 ENCOUNTER — Encounter: Payer: Self-pay | Admitting: Obstetrics and Gynecology

## 2012-12-29 VITALS — BP 108/60 | Wt 118.0 lb

## 2012-12-29 DIAGNOSIS — Z331 Pregnant state, incidental: Secondary | ICD-10-CM

## 2012-12-29 NOTE — Progress Notes (Signed)
[redacted]w[redacted]d No complaints today. 1 hr gtt today. Fetal cardiac ECHO 01/05/13 at [facility]

## 2012-12-30 LAB — RPR

## 2012-12-30 LAB — GLUCOSE TOLERANCE, 1 HOUR (50G) W/O FASTING: Glucose, 1 Hour GTT: 104 mg/dL (ref 70–140)

## 2013-01-01 ENCOUNTER — Encounter: Payer: Self-pay | Admitting: Obstetrics and Gynecology

## 2013-01-12 ENCOUNTER — Ambulatory Visit: Payer: Medicaid Other | Admitting: Obstetrics and Gynecology

## 2013-01-12 VITALS — BP 96/42 | Wt 120.0 lb

## 2013-01-12 DIAGNOSIS — Z3490 Encounter for supervision of normal pregnancy, unspecified, unspecified trimester: Secondary | ICD-10-CM

## 2013-01-12 DIAGNOSIS — Z331 Pregnant state, incidental: Secondary | ICD-10-CM

## 2013-01-12 NOTE — Progress Notes (Signed)
Pt stated no issues today.  Fetal echo done.  ROI for results

## 2013-01-21 ENCOUNTER — Telehealth: Payer: Self-pay | Admitting: Obstetrics and Gynecology

## 2013-01-21 NOTE — Telephone Encounter (Signed)
Tc to pt regarding msg.  Lm on vm to call back or speak with a provider on call this evening if unable to wait until phones re-open in the am.

## 2013-01-25 ENCOUNTER — Other Ambulatory Visit: Payer: Medicaid Other

## 2013-01-27 ENCOUNTER — Ambulatory Visit: Payer: Medicaid Other | Admitting: Obstetrics and Gynecology

## 2013-01-27 ENCOUNTER — Encounter: Payer: Self-pay | Admitting: Obstetrics and Gynecology

## 2013-01-27 VITALS — BP 110/60 | Wt 122.0 lb

## 2013-01-27 DIAGNOSIS — Z331 Pregnant state, incidental: Secondary | ICD-10-CM

## 2013-01-27 NOTE — Progress Notes (Signed)
[redacted]w[redacted]d Pt has no complaints   

## 2013-01-27 NOTE — Progress Notes (Signed)
[redacted]w[redacted]d  Pt unable to leave sample at this time, pt given water.  Pt c/o: low back pain , worse w/ walking and standing.

## 2013-01-27 NOTE — Progress Notes (Signed)
[redacted]w[redacted]d Back pain exercises not working : will refer to PT Glucola normal

## 2013-01-28 ENCOUNTER — Telehealth: Payer: Self-pay | Admitting: Obstetrics and Gynecology

## 2013-01-28 NOTE — Telephone Encounter (Signed)
Tc to pt's home number. Not working.. Work number incorrect. Contact number states incorr

## 2013-01-28 NOTE — Telephone Encounter (Signed)
Letter mailed to pt to contact office

## 2013-01-28 NOTE — Telephone Encounter (Signed)
Message copied by Mason Jim on Thu Jan 28, 2013 10:21 AM ------      Message from: Silverio Lay      Created: Wed Jan 27, 2013  5:19 PM       Please refer to Physical therapy            32+ weeks with right sciatica ( Medicaid) ------

## 2013-02-01 ENCOUNTER — Telehealth: Payer: Self-pay | Admitting: Obstetrics and Gynecology

## 2013-02-01 ENCOUNTER — Other Ambulatory Visit: Payer: Self-pay | Admitting: Obstetrics and Gynecology

## 2013-02-01 NOTE — Telephone Encounter (Signed)
Returned pt's call. States will be able to go to PT at [facility]. Referral ordered. Pt to call if does not receive app this week. States + FM. No cont

## 2013-02-08 ENCOUNTER — Ambulatory Visit (HOSPITAL_BASED_OUTPATIENT_CLINIC_OR_DEPARTMENT_OTHER): Payer: Medicaid Other | Admitting: Oncology

## 2013-02-08 ENCOUNTER — Encounter: Payer: Self-pay | Admitting: Oncology

## 2013-02-08 ENCOUNTER — Other Ambulatory Visit (HOSPITAL_BASED_OUTPATIENT_CLINIC_OR_DEPARTMENT_OTHER): Payer: Medicaid Other | Admitting: Lab

## 2013-02-08 ENCOUNTER — Telehealth: Payer: Self-pay | Admitting: Oncology

## 2013-02-08 VITALS — BP 112/66 | HR 91 | Temp 97.9°F | Resp 20 | Ht 62.0 in | Wt 121.4 lb

## 2013-02-08 DIAGNOSIS — D689 Coagulation defect, unspecified: Secondary | ICD-10-CM

## 2013-02-08 DIAGNOSIS — O99119 Other diseases of the blood and blood-forming organs and certain disorders involving the immune mechanism complicating pregnancy, unspecified trimester: Secondary | ICD-10-CM

## 2013-02-08 DIAGNOSIS — D696 Thrombocytopenia, unspecified: Secondary | ICD-10-CM

## 2013-02-08 LAB — CBC WITH DIFFERENTIAL/PLATELET
BASO%: 0.3 % (ref 0.0–2.0)
EOS%: 1 % (ref 0.0–7.0)
HCT: 35.6 % (ref 34.8–46.6)
LYMPH%: 13.5 % — ABNORMAL LOW (ref 14.0–49.7)
MCH: 29.7 pg (ref 25.1–34.0)
MCHC: 34.4 g/dL (ref 31.5–36.0)
NEUT%: 78.9 % — ABNORMAL HIGH (ref 38.4–76.8)
Platelets: 123 10*3/uL — ABNORMAL LOW (ref 145–400)
RBC: 4.13 10*6/uL (ref 3.70–5.45)
WBC: 11 10*3/uL — ABNORMAL HIGH (ref 3.9–10.3)

## 2013-02-08 LAB — COMPREHENSIVE METABOLIC PANEL (CC13)
ALT: 18 U/L (ref 0–55)
AST: 16 U/L (ref 5–34)
Alkaline Phosphatase: 143 U/L (ref 40–150)
BUN: 8.7 mg/dL (ref 7.0–26.0)
Creatinine: 0.7 mg/dL (ref 0.6–1.1)
Total Bilirubin: 0.58 mg/dL (ref 0.20–1.20)

## 2013-02-08 LAB — LACTATE DEHYDROGENASE (CC13): LDH: 167 U/L (ref 125–245)

## 2013-02-08 NOTE — Progress Notes (Signed)
This office note has been dictated.  #[id number]

## 2013-02-08 NOTE — Progress Notes (Signed)
CC:   Hal Morales, M.D.  PROBLEM LIST: 1. Thrombocytopenia with a history of going back to 10/01/2011 when     the platelet count was 105,000.  On 10/22/2012 platelet count was     134,000 and on 12/11/2012 platelet count was 127,000.  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 The patient's father apparently had a history of        thrombocytopenia. 2. Pregnancy with an expected due date of 03/22/2013.  This is the     patient's first pregnancy. 3. History of easy bruising and prolonged bleeding.  A von Willebrand     panel carried out on 10/22/2012 during the patient's pregnancy was     normal.  Ideally the von Willebrand panel should be checked when     the patient is not pregnant at some point. 4. Family history of Huntington's chorea involving the patient's     maternal grandfather and maternal uncle.  MEDICATIONS:  Reviewed and recorded. Current Outpatient Prescriptions  Medication Sig Dispense Refill  . Prenatal Vit-Fe Fumarate-FA (PRENATAL VITAMINS) 28-0.8 MG TABS Take 2 tablets by mouth daily.       No current facility-administered medications for this visit.    SMOKING HISTORY:  The patient smoked about a quarter of a pack of cigarettes a day for 12 years.  She has not smoked during this Pregnancy.   HISTORY:  I am seeing Tyriana Helmkamp today for followup of her thrombocytopenia.  The patient denies any bleeding or bruising problems. Apparently her pregnancy has been going smoothly.  She is now in her 34th week in the pregnancy with an expected due date of 03/22/2013.  She has not had any bruising or bleeding, and reports this is somewhat improved.  She is currently seeing Dr. Pennie Rushing or one of her associates every 2 weeks.  She is without complaints today.  PHYSICAL EXAMINATION:  The patient is a young woman who is in the latter stages of pregnancy.  Weight is 121 pounds 6.4 ounces as compared with 113 pounds on 12/11/2012.  Height 5 feet 2 inches.  Body surface  area 1.55 sq m.  Blood pressure 112/66.  Other vital signs are normal.  There is no scleral icterus.  Mouth and pharynx are benign.  No peripheral adenopathy palpable.  Heart and lungs are normal.  Abdomen obviously gravid uterus without any other visible or palpable abnormalities. Extremities:  No peripheral edema.  The patient has a tongue ring and several tattoos.  LABORATORY DATA:  White count 11.0, ANC 8.7, hemoglobin 12.3, hematocrit 35.6, platelets 123,000.  Chemistries today notable for a glucose of 126 and an albumin of 2.6.  Vitamin B12 level on 10/22/2012 was 537.  TSH was 1.188.  RPR was nonreactive, and the von Willebrand panel during pregnancy was normal.  IMAGING STUDIES: 1. CT sinuses limited without contrast on 04/29/2009 showed chronic sinus disease.  2. US OB Complete less than 14 weeks showed live single intrauterine pregnancy. Gestational age by first trimester ultrasound is 8 weeks and 5 days.   IMPRESSION AND PLAN:  [name] is having an uncomplicated pregnancy despite her mild thrombocytopenia with a platelet count today of 123,000.  Hopefully, the patient's platelet count will remain above 100,000.  If it were to drop much below 100,000 and certainly below 80,000 then we would probably consider treating the patient with prednisone hopefully to allow for an epidural anesthesia.  As stated, the patient is being seen by her  obstetricians every 2 weeks. I have suggested that we check a CBC in approximately 3 weeks which will be around April 15, another CBC around April 28, roughly a week prior to the expected due date.  I would also like to check a CBC at a time when the patient is not pregnant, somewhere around the end of June to see what her baseline platelet count runs.  I have not scheduled her for a followup appointment at this time.    ______________________________ Samul Dada, M.D. DSM/MEDQ  D:  02/08/2013  T:  02/08/2013  Job:  [id number]

## 2013-02-08 NOTE — Telephone Encounter (Signed)
gv and printed appt scheudle for pt for April and June

## 2013-02-11 ENCOUNTER — Ambulatory Visit: Payer: Medicaid Other | Attending: Obstetrics and Gynecology | Admitting: Physical Therapy

## 2013-02-11 DIAGNOSIS — R293 Abnormal posture: Secondary | ICD-10-CM | POA: Insufficient documentation

## 2013-02-11 DIAGNOSIS — M25559 Pain in unspecified hip: Secondary | ICD-10-CM | POA: Insufficient documentation

## 2013-02-11 DIAGNOSIS — M545 Low back pain, unspecified: Secondary | ICD-10-CM | POA: Insufficient documentation

## 2013-02-11 DIAGNOSIS — IMO0001 Reserved for inherently not codable concepts without codable children: Secondary | ICD-10-CM | POA: Insufficient documentation

## 2013-02-18 ENCOUNTER — Ambulatory Visit: Payer: Medicaid Other | Admitting: Physical Therapy

## 2013-03-02 ENCOUNTER — Other Ambulatory Visit (HOSPITAL_BASED_OUTPATIENT_CLINIC_OR_DEPARTMENT_OTHER): Payer: Medicaid Other

## 2013-03-02 ENCOUNTER — Telehealth: Payer: Self-pay

## 2013-03-02 DIAGNOSIS — D696 Thrombocytopenia, unspecified: Secondary | ICD-10-CM

## 2013-03-02 LAB — CBC WITH DIFFERENTIAL/PLATELET
Basophils Absolute: 0 10*3/uL (ref 0.0–0.1)
EOS%: 1.4 % (ref 0.0–7.0)
HCT: 32.7 % — ABNORMAL LOW (ref 34.8–46.6)
HGB: 11.2 g/dL — ABNORMAL LOW (ref 11.6–15.9)
LYMPH%: 13.8 % — ABNORMAL LOW (ref 14.0–49.7)
MCH: 29.4 pg (ref 25.1–34.0)
MCV: 86 fL (ref 79.5–101.0)
MONO%: 8.7 % (ref 0.0–14.0)
NEUT%: 75.9 % (ref 38.4–76.8)
Platelets: 111 10*3/uL — ABNORMAL LOW (ref 145–400)
lymph#: 1.7 10*3/uL (ref 0.9–3.3)

## 2013-03-02 NOTE — Telephone Encounter (Signed)
S/w pt that her platelet count was 111K and the goal is > 100K. Also faxed labs to her OB/GYN, [facility] [place] [facility].

## 2013-03-02 NOTE — Progress Notes (Signed)
Quick Note:  Please notify patient and call/fax these results to patient's doctors. ______ 

## 2013-03-06 ENCOUNTER — Encounter (HOSPITAL_COMMUNITY): Payer: Self-pay | Admitting: Anesthesiology

## 2013-03-06 ENCOUNTER — Inpatient Hospital Stay (HOSPITAL_COMMUNITY): Payer: Medicaid Other | Admitting: Anesthesiology

## 2013-03-06 ENCOUNTER — Inpatient Hospital Stay (HOSPITAL_COMMUNITY)
Admission: AD | Admit: 2013-03-06 | Discharge: 2013-03-08 | DRG: 774 | Disposition: A | Discharge status: Home / Self Care | Payer: Medicaid Other | Source: Ambulatory Visit | Attending: Obstetrics and Gynecology | Admitting: Obstetrics and Gynecology

## 2013-03-06 ENCOUNTER — Encounter (HOSPITAL_COMMUNITY): Payer: Self-pay | Admitting: Family

## 2013-03-06 DIAGNOSIS — D500 Iron deficiency anemia secondary to blood loss (chronic): Secondary | ICD-10-CM | POA: Diagnosis not present

## 2013-03-06 DIAGNOSIS — D696 Thrombocytopenia, unspecified: Secondary | ICD-10-CM | POA: Diagnosis present

## 2013-03-06 DIAGNOSIS — IMO0002 Reserved for concepts with insufficient information to code with codable children: Secondary | ICD-10-CM

## 2013-03-06 DIAGNOSIS — D689 Coagulation defect, unspecified: Secondary | ICD-10-CM | POA: Diagnosis present

## 2013-03-06 DIAGNOSIS — O9903 Anemia complicating the puerperium: Secondary | ICD-10-CM | POA: Diagnosis not present

## 2013-03-06 DIAGNOSIS — O9912 Other diseases of the blood and blood-forming organs and certain disorders involving the immune mechanism complicating childbirth: Secondary | ICD-10-CM | POA: Diagnosis present

## 2013-03-06 DIAGNOSIS — Z820 Family history of epilepsy and other diseases of the nervous system: Secondary | ICD-10-CM

## 2013-03-06 LAB — CBC
HCT: 34.8 % — ABNORMAL LOW (ref 36.0–46.0)
HCT: 26.2 % — ABNORMAL LOW (ref 36.0–46.0)
Hemoglobin: 9.1 g/dL — ABNORMAL LOW (ref 12.0–15.0)
MCH: 29.4 pg (ref 26.0–34.0)
MCH: 29.9 pg (ref 26.0–34.0)
MCHC: 34.7 g/dL (ref 30.0–36.0)
MCV: 86.1 fL (ref 78.0–100.0)
MCV: 85.9 fL (ref 78.0–100.0)
MCV: 86.2 fL (ref 78.0–100.0)
Platelets: 118 10*3/uL — ABNORMAL LOW (ref 150–400)
Platelets: 126 10*3/uL — ABNORMAL LOW (ref 150–400)
RBC: 4.04 MIL/uL (ref 3.87–5.11)
RBC: 3.04 MIL/uL — ABNORMAL LOW (ref 3.87–5.11)
RDW: 14.1 % (ref 11.5–15.5)
RDW: 14 % (ref 11.5–15.5)
WBC: 20.7 10*3/uL — ABNORMAL HIGH (ref 4.0–10.5)
WBC: 25.6 10*3/uL — ABNORMAL HIGH (ref 4.0–10.5)

## 2013-03-06 LAB — PROTIME-INR: INR: 1.09 (ref 0.00–1.49)

## 2013-03-06 LAB — ABO/RH: ABO/RH(D): O POS

## 2013-03-06 MED ORDER — SODIUM BICARBONATE 8.4 % IV SOLN
INTRAVENOUS | Status: DC | PRN
  Administered 2013-03-06: 5 mL via EPIDURAL

## 2013-03-06 MED ORDER — ZOLPIDEM TARTRATE 5 MG PO TABS: 5.0000 mg | ORAL_TABLET | Freq: Every evening | ORAL | Status: DC | PRN

## 2013-03-06 MED ORDER — OXYCODONE-ACETAMINOPHEN 5-325 MG/5ML PO SOLN: 5.0000 mL | ORAL | Status: DC | PRN

## 2013-03-06 MED ORDER — MEDROXYPROGESTERONE ACETATE 150 MG/ML IM SUSP: 150.0000 mg | INTRAMUSCULAR | Status: DC | PRN

## 2013-03-06 MED ORDER — LIDOCAINE HCL (PF) 1 % IJ SOLN
30.0000 mL | INTRAMUSCULAR | Status: DC | PRN
  Administered 2013-03-06: 30 mL via SUBCUTANEOUS
  Filled 2013-03-06 (×2): qty 30

## 2013-03-06 MED ORDER — LACTATED RINGERS IV SOLN: 500.0000 mL | Freq: Once | INTRAVENOUS | Status: DC

## 2013-03-06 MED ORDER — PHENYLEPHRINE 40 MCG/ML (10ML) SYRINGE FOR IV PUSH (FOR BLOOD PRESSURE SUPPORT)
80.0000 ug | PREFILLED_SYRINGE | INTRAVENOUS | Status: DC | PRN
  Filled 2013-03-06: qty 2

## 2013-03-06 MED ORDER — EPHEDRINE 5 MG/ML INJ
10.0000 mg | INTRAVENOUS | Status: DC | PRN
  Filled 2013-03-06: qty 4
  Filled 2013-03-06: qty 2

## 2013-03-06 MED ORDER — IBUPROFEN 600 MG PO TABS: 600.0000 mg | ORAL_TABLET | Freq: Four times a day (QID) | ORAL | Status: DC

## 2013-03-06 MED ORDER — DIPHENHYDRAMINE HCL 25 MG PO CAPS: 25.0000 mg | ORAL_CAPSULE | Freq: Four times a day (QID) | ORAL | Status: DC | PRN

## 2013-03-06 MED ORDER — LACTATED RINGERS IV SOLN
INTRAVENOUS | Status: DC
  Administered 2013-03-07: 09:00:00 via INTRAVENOUS

## 2013-03-06 MED ORDER — OXYCODONE-ACETAMINOPHEN 5-325 MG PO TABS: 1.0000 | ORAL_TABLET | ORAL | Status: DC | PRN

## 2013-03-06 MED ORDER — WITCH HAZEL-GLYCERIN EX PADS: 1.0000 "application " | MEDICATED_PAD | CUTANEOUS | Status: DC | PRN

## 2013-03-06 MED ORDER — DIBUCAINE 1 % RE OINT: 1.0000 "application " | TOPICAL_OINTMENT | RECTAL | Status: DC | PRN

## 2013-03-06 MED ORDER — LACTATED RINGERS IV SOLN
500.0000 mL | INTRAVENOUS | Status: DC | PRN
  Administered 2013-03-06: 1000 mL via INTRAVENOUS
  Administered 2013-03-06: 500 mL via INTRAVENOUS
  Administered 2013-03-06: 1000 mL via INTRAVENOUS

## 2013-03-06 MED ORDER — MAGNESIUM HYDROXIDE 400 MG/5ML PO SUSP: 30.0000 mL | ORAL | Status: DC | PRN

## 2013-03-06 MED ORDER — OXYTOCIN 40 UNITS IN LACTATED RINGERS INFUSION - SIMPLE MED
62.5000 mL/h | INTRAVENOUS | Status: DC
  Administered 2013-03-06 (×2): 62.5 mL/h via INTRAVENOUS
  Filled 2013-03-06 (×2): qty 1000

## 2013-03-06 MED ORDER — IBUPROFEN 100 MG/5ML PO SUSP
600.0000 mg | Freq: Four times a day (QID) | ORAL | Status: DC
  Administered 2013-03-06 – 2013-03-08 (×7): 600 mg via ORAL
  Filled 2013-03-06 (×11): qty 30

## 2013-03-06 MED ORDER — OXYTOCIN 40 UNITS IN LACTATED RINGERS INFUSION - SIMPLE MED: 62.5000 mL/h | INTRAVENOUS | Status: DC

## 2013-03-06 MED ORDER — SIMETHICONE 80 MG PO CHEW: 80.0000 mg | CHEWABLE_TABLET | ORAL | Status: DC | PRN

## 2013-03-06 MED ORDER — SODIUM CHLORIDE 0.9 % IV SOLN
3.0000 g | Freq: Once | INTRAVENOUS | Status: DC
  Filled 2013-03-06: qty 3

## 2013-03-06 MED ORDER — SODIUM CHLORIDE 0.9 % IV SOLN
1.5000 g | Freq: Four times a day (QID) | INTRAVENOUS | Status: DC
  Administered 2013-03-07 – 2013-03-08 (×6): 1.5 g via INTRAVENOUS
  Filled 2013-03-06 (×7): qty 1.5

## 2013-03-06 MED ORDER — ACETAMINOPHEN 325 MG PO TABS: 650.0000 mg | ORAL_TABLET | ORAL | Status: DC | PRN

## 2013-03-06 MED ORDER — IBUPROFEN 600 MG PO TABS
600.0000 mg | ORAL_TABLET | Freq: Four times a day (QID) | ORAL | Status: DC | PRN
  Filled 2013-03-06 (×3): qty 1

## 2013-03-06 MED ORDER — CITRIC ACID-SODIUM CITRATE 334-500 MG/5ML PO SOLN
30.0000 mL | ORAL | Status: DC | PRN
  Filled 2013-03-06: qty 15

## 2013-03-06 MED ORDER — ONDANSETRON HCL 4 MG/2ML IJ SOLN: 4.0000 mg | INTRAMUSCULAR | Status: DC | PRN

## 2013-03-06 MED ORDER — PHENYLEPHRINE 40 MCG/ML (10ML) SYRINGE FOR IV PUSH (FOR BLOOD PRESSURE SUPPORT)
80.0000 ug | PREFILLED_SYRINGE | INTRAVENOUS | Status: DC | PRN
  Filled 2013-03-06: qty 2
  Filled 2013-03-06: qty 5

## 2013-03-06 MED ORDER — METHYLERGONOVINE MALEATE 0.2 MG PO TABS: 0.2000 mg | ORAL_TABLET | ORAL | Status: DC | PRN

## 2013-03-06 MED ORDER — SENNOSIDES-DOCUSATE SODIUM 8.6-50 MG PO TABS: 2.0000 | ORAL_TABLET | Freq: Every day | ORAL | Status: DC

## 2013-03-06 MED ORDER — TETANUS-DIPHTH-ACELL PERTUSSIS 5-2.5-18.5 LF-MCG/0.5 IM SUSP
0.5000 mL | Freq: Once | INTRAMUSCULAR | Status: AC
  Administered 2013-03-07: 0.5 mL via INTRAMUSCULAR

## 2013-03-06 MED ORDER — EPHEDRINE 5 MG/ML INJ
10.0000 mg | INTRAVENOUS | Status: DC | PRN
  Filled 2013-03-06: qty 2

## 2013-03-06 MED ORDER — ONDANSETRON HCL 4 MG/2ML IJ SOLN: 4.0000 mg | Freq: Four times a day (QID) | INTRAMUSCULAR | Status: DC | PRN

## 2013-03-06 MED ORDER — ONDANSETRON HCL 4 MG PO TABS: 4.0000 mg | ORAL_TABLET | ORAL | Status: DC | PRN

## 2013-03-06 MED ORDER — LIDOCAINE HCL (PF) 1 % IJ SOLN
INTRAMUSCULAR | Status: DC | PRN
  Administered 2013-03-06 (×4): 4 mL

## 2013-03-06 MED ORDER — LANOLIN HYDROUS EX OINT: TOPICAL_OINTMENT | CUTANEOUS | Status: DC | PRN

## 2013-03-06 MED ORDER — METHYLERGONOVINE MALEATE 0.2 MG/ML IJ SOLN
INTRAMUSCULAR | Status: AC
  Administered 2013-03-06: 0.2 mg
  Filled 2013-03-06: qty 1

## 2013-03-06 MED ORDER — FENTANYL CITRATE 0.05 MG/ML IJ SOLN
100.0000 ug | Freq: Once | INTRAMUSCULAR | Status: DC
  Filled 2013-03-06: qty 2

## 2013-03-06 MED ORDER — PRENATAL MULTIVITAMIN CH
1.0000 | ORAL_TABLET | Freq: Every day | ORAL | Status: DC
  Filled 2013-03-06: qty 1

## 2013-03-06 MED ORDER — BENZOCAINE-MENTHOL 20-0.5 % EX AERO
1.0000 "application " | INHALATION_SPRAY | CUTANEOUS | Status: DC | PRN
  Administered 2013-03-07: 1 via TOPICAL
  Filled 2013-03-06: qty 56

## 2013-03-06 MED ORDER — METHYLERGONOVINE MALEATE 0.2 MG/ML IJ SOLN: 0.2000 mg | INTRAMUSCULAR | Status: DC | PRN

## 2013-03-06 MED ORDER — METHYLERGONOVINE MALEATE 0.2 MG/ML IJ SOLN: 0.2000 mg | Freq: Once | INTRAMUSCULAR | Status: DC

## 2013-03-06 MED ORDER — LACTATED RINGERS IV SOLN
INTRAVENOUS | Status: DC
  Administered 2013-03-06 – 2013-03-07 (×3): via INTRAVENOUS

## 2013-03-06 MED ORDER — FENTANYL 2.5 MCG/ML BUPIVACAINE 1/10 % EPIDURAL INFUSION (WH - ANES)
14.0000 mL/h | INTRAMUSCULAR | Status: DC | PRN
  Administered 2013-03-06: 14 mL/h via EPIDURAL
  Filled 2013-03-06: qty 125

## 2013-03-06 MED ORDER — OXYTOCIN BOLUS FROM INFUSION: 500.0000 mL | INTRAVENOUS | Status: DC

## 2013-03-06 MED ORDER — DIPHENHYDRAMINE HCL 50 MG/ML IJ SOLN: 12.5000 mg | INTRAMUSCULAR | Status: DC | PRN

## 2013-03-06 NOTE — Progress Notes (Signed)
Called to see pt because of bleeding.  Pelvic exam vagina in tact.  Cervix intact with clot at the os.  With sterile golves I explored the uterus and and lower uterine segment.  Large amount of clot was obtained along with placenta fragments.  Once the clot and retained placenta removed, I no longer felt any retained products.  Pt given unasyn to help prevent endometritis and methergine to prevent uterine atony.    A foley catheter was placed.  Will monitor CBC in 4 hours and vital signs. If bleeding becomes heavy again pt consented for exam in the OR and D&E with possible Bakri baloon and hysterectomy.  EBL 15oocc.

## 2013-03-06 NOTE — Progress Notes (Addendum)
In to evaluate patient s/p PPH. Resting comfortably, but "very tired". O2 per [place] in place.  Appears pale, but oriented. Baby breastfeeding well.  BP 108-122/59-64 (isolated value of 122/96, likely erroneous reading) P 93-108.  R 16.  O2 sat 97-99%.  Fundus firm, bleeding scant. Perineum stable.  Will CTO in L&D till 9pm. Offer light meal and po fluids. CBC scheduled at 10p. Dr. Normand Sloop updated on patient status.  Nigel Bridgeman, CNM 03/06/13 7:30pm

## 2013-03-06 NOTE — Progress Notes (Signed)
  Subjective: Pt feeling UC more and having to breath through them. Denies any rectal pressure.  Objective: BP 111/65  Pulse 98  Temp(Src) 96.8 F (36 C) (Oral)  Resp 18  Ht 5\' 2"  (1.575 m)  Wt 127 lb (57.607 kg)  BMI 23.22 kg/m2  SpO2 100%  LMP 05/31/2012     FHT:  Cat I UC:   regular, every 2-3 minutes  SVE @ 1352:   Dilation: 7 Effacement (%): 100 Station: 0 Exam by:: [email or web address], RN  Assessment / Plan: Active labor Cat I Epidural   CTO labor progress AROM as appropriate C/w Dr. Normand Sloop as needed  Kathleen Romero 03/06/2013, 2:25 PM

## 2013-03-06 NOTE — Progress Notes (Signed)
Able to sit and dangle on side of bed without syncope or dizziness--felt slightly weak. Filed Vitals:   03/06/13 2132 03/06/13 2136 03/06/13 2147 03/06/13 2149  BP: 120/70  105/65   Pulse: 126 123 127 131  Temp:      TempSrc:      Resp:      Height:      Weight:      SpO2:  98%  99%   CBC done: Hgb 9.1 (9.6 at 5:53p, 11.7 on admission) Hct 26.2 Plat 122 (126 at 5:53p, 118 on admission) WBC 32 (25.6 at 5:53p, 20.7 on admission)  Will d/c to [facility]. Maintain IV infusion, foley, and epidural catheter until am rounds. CBC in am. Continue po Methergine course. Dr. Normand Sloop updated.  Nigel Bridgeman, CNM 03/06/13 10p

## 2013-03-06 NOTE — Progress Notes (Signed)
Manuel evacuation of placenta products performed by Dr. Scherry Ran remains at bedside and dosed pt epidural

## 2013-03-06 NOTE — MAU Note (Signed)
Patient presents to MAU with c/o contractions since 0830 today with bloody show. Reports + fetal movement.

## 2013-03-06 NOTE — H&P (Signed)
Kathleen Romero is a 27 y.o. female, G1P0000 at [redacted]w[redacted]d, presenting for UCs.  Pt reports bloody show; denies LOF.  Pt vocalizing and breathing through ctx.  Pt unsure as to choice of pain control at this time.  Patient Active Problem List  Diagnosis  . Normal IUP (intrauterine pregnancy) on prenatal ultrasound  . Pregnant state, incidental  . Family history of congenital heart defect  . FHx: Huntington's chorea  . LGSIL (low grade squamous intraepithelial dysplasia)  . Thrombocytopenia, unspecified    History of present pregnancy: Patient entered care at 8 weeks.   EDC of 03/22/13 was established by 8w U/S.   Anatomy scan:  20 weeks, with normal findings and an anterior placenta.   Additional [place] evaluations:  n/a.   Significant prenatal events:  01/05/13 - Fetal echo d/t FOB with congenital heart defect Results: There is normal fetal cardiac anatomy and function. No major heart disease was identified Last evaluation:  03/04/13  OB History   Grav Para Term Preterm Abortions TAB SAB Ect Mult Living   1              Past Medical History  Diagnosis Date  . No pertinent past medical history   . Infection     Yeast;was treated 08/15/12 w/ Monistat 7  . Infection     BV x 2   Past Surgical History  Procedure Laterality Date  . No past surgeries     Family History: family history includes Alcohol abuse in her father; Asthma in her paternal uncle; Hypertension in her father and mother; and Other in her other.  Social History:  reports that she quit smoking about 7 months ago. Her smoking use included Cigarettes and Cigars. She has a 3 pack-year smoking history. She has never used smokeless tobacco. She reports that she does not drink alcohol or use illicit drugs.   Prenatal Transfer Tool  Maternal Diabetes: No Genetic Screening: Normal Maternal Ultrasounds/Referrals: Normal Fetal Ultrasounds or other Referrals:  [facility]etal [facility]: Results - There is normal fetal cardiac anatomy and  function. No major heart disease was identified Maternal Substance Abuse:  No Significant Maternal Medications:  None Significant Maternal Lab Results: Lab values include: Group B Strep negative    ROS:  HPI above, all other systems negative  No Known Allergies   Dilation: 3.5 Effacement (%): 70 Exam by:: Aaliya Maultsby, CNM  Temperature 96.8 F (36 C), temperature source Oral, resp. rate 16, last menstrual period 05/31/2012.  Chest clear Heart RRR without murmur Abd gravid, NT Pelvic: see above, no VB or blood show noted Ext: WNL  FHR: Cat I UCs:  q 3-4 min regular  Prenatal labs: ABO, Rh: O/POS/-- (10/15 1404) Antibody: NEG (10/15 1404) Rubella:   Immune RPR: NON REAC (02/11 1509)  HBsAg: NEGATIVE (10/15 1404)  HIV: NON REACTIVE (10/15 1404)  GBS:  Negative Sickle cell/Hgb electrophoresis:  n/a Pap:  LSIL GC:  Negative Chlamydia:  Negative Genetic screenings:  Normal Glucola:  1hr = 104 Other:  No laboratory evidence of von Willebrand disease - test done 10/22/12     Assessment/Plan: IUP at [redacted]w[redacted]d Active Labor GBS Neg Thrombocytopenia - sees Dr. Arline Asp at [facility].  Needed CBC wk of 4/15, 4/28, and to see him pp  Admit to [facility] per consult with Dr. Normand Sloop as attending MD Routine [facility] orders Epidural per patient request AROM as appropriate  Rowan Blase, MSN 03/06/2013, 10:09 AM

## 2013-03-06 NOTE — Progress Notes (Signed)
  Subjective: RN called to let me know pt is reporting rectal pressure. Pt comfortable with epidural.   Objective: BP 111/65  Pulse 98  Temp(Src) 98.7 F (37.1 C) (Oral)  Resp 18  Ht 5\' 2"  (1.575 m)  Wt 127 lb (57.607 kg)  BMI 23.22 kg/m2  SpO2 100%  LMP 05/31/2012     FHT:  Cat I UC:   regular, every 2-3 minutes  Right and anterior lip / 100% / 0 / bloody show  AROM: clear fluid  Assessment / Plan: Active labor Cat I Epidural AROM  CTO labor progress C/w Dr. Normand Sloop as needed   Haroldine Laws 03/06/2013, 2:54 PM

## 2013-03-06 NOTE — Anesthesia Preprocedure Evaluation (Signed)
Anesthesia Evaluation  Patient identified by MRN, date of birth, ID band Patient awake    Reviewed: Allergy & Precautions, H&P , NPO status , Patient's Chart, lab work & pertinent test results, reviewed documented beta blocker date and time   History of Anesthesia Complications Negative for: history of anesthetic complications  Airway Mallampati: I TM Distance: >3 FB Neck ROM: full    Dental  (+) Teeth Intact   Pulmonary former smoker (quit 9/13),  breath sounds clear to auscultation        Cardiovascular negative cardio ROS  Rhythm:regular Rate:Normal     Neuro/Psych negative neurological ROS  negative psych ROS   GI/Hepatic negative GI ROS, Neg liver ROS,   Endo/Other  negative endocrine ROS  Renal/GU negative Renal ROS     Musculoskeletal   Abdominal   Peds  Hematology thrombocytopenia   Anesthesia Other Findings multiple oral/facial piercings - asked to remove  Reproductive/Obstetrics (+) Pregnancy                           Anesthesia Physical Anesthesia Plan  ASA: II  Anesthesia Plan: Epidural   Post-op Pain Management:    Induction:   Airway Management Planned:   Additional Equipment:   Intra-op Plan:   Post-operative Plan:   Informed Consent: I have reviewed the patients History and Physical, chart, labs and discussed the procedure including the risks, benefits and alternatives for the proposed anesthesia with the patient or authorized representative who has indicated his/her understanding and acceptance.     Plan Discussed with:   Anesthesia Plan Comments:         Anesthesia Quick Evaluation

## 2013-03-06 NOTE — Progress Notes (Signed)
In to see patient. Feeling better, but still reports feeling very tired. Family at bedside.  Filed Vitals:   03/06/13 2002 03/06/13 2017 03/06/13 2032 03/06/13 2047  BP: 113/68 127/79 120/68 125/71  Pulse: 105 116 118 112  Temp:      TempSrc:      Resp:  16    Height:      Weight:      SpO2: 98% 100% 100% 100%   Foley draining clear urine.  1,050 cc out since delivery. Fundus firm. Small amount lochia noted--no clots expressed. On Methergine po course. Able to move legs and lift hips off bed  Will check CBC prior to decision regarding transfer. D/C O2 and observe sats. Attempt to sit up and dangle at bedside.  Nigel Bridgeman, CNM 03/06/13 9:15pm

## 2013-03-06 NOTE — Progress Notes (Signed)
  Subjective: Pt is comfortable with epidural now.    Objective: BP 104/67  Pulse 97  Temp(Src) 96.8 F (36 C) (Oral)  Resp 18  Ht 5\' 2"  (1.575 m)  Wt 127 lb (57.607 kg)  BMI 23.22 kg/m2  SpO2 100%  LMP 05/31/2012     FHT:  Cat II - O2, position changes, BP WNL UC:   regular, every 1-3 minutes  SVE @ 1208:   Dilation: 5.5 Effacement (%): 90 Station: -1 Exam by:: j.Thornton, RN  Assessment / Plan: IUP at [redacted]w[redacted]d Active labor Cat II tracing Epidural  C/w Dr. Normand Sloop Anticipate AROM and FSE as appropriate Continue intrauterine resuscitation measures as needed  Kathleen Romero 03/06/2013, 12:55 PM

## 2013-03-06 NOTE — Progress Notes (Signed)
Provider called to bedside for bleeding--estimated blood loss at 500cc total--continue to have constant trickling of blood loss

## 2013-03-06 NOTE — Anesthesia Procedure Notes (Signed)
Epidural Patient location during procedure: OB Start time: 03/06/2013 12:17 PM  Staffing Performed by: anesthesiologist   Preanesthetic Checklist Completed: patient identified, site marked, surgical consent, pre-op evaluation, timeout performed, IV checked, risks and benefits discussed and monitors and equipment checked  Epidural Patient position: sitting Prep: site prepped and draped and DuraPrep Patient monitoring: continuous pulse ox and blood pressure Approach: midline Injection technique: LOR air  Needle:  Needle type: Tuohy  Needle gauge: 17 G Needle length: 9 cm and 9 Needle insertion depth: 5 cm cm Catheter type: closed end flexible Catheter size: 19 Gauge Catheter at skin depth: 10 cm Test dose: negative  Assessment Events: blood not aspirated, injection not painful, no injection resistance, negative IV test and no paresthesia  Additional Notes Discussed risk of headache, infection, bleeding, nerve injury and failed or incomplete block.  Patient voices understanding and wishes to proceed.  Epidural placed easily on first attempt.  No paresthesia.  Patient tolerated procedure well with no apparent complications.  Jasmine December, MDReason for block:procedure for pain

## 2013-03-06 NOTE — Progress Notes (Signed)
PPH code called--all team members resonded

## 2013-03-07 LAB — CBC WITH DIFFERENTIAL/PLATELET
Basophils Absolute: 0 10*3/uL (ref 0.0–0.1)
Eosinophils Absolute: 0 10*3/uL (ref 0.0–0.7)
Eosinophils Relative: 0 % (ref 0–5)
HCT: 20.3 % — ABNORMAL LOW (ref 36.0–46.0)
Lymphocytes Relative: 7 % — ABNORMAL LOW (ref 12–46)
MCH: 29.7 pg (ref 26.0–34.0)
MCV: 86 fL (ref 78.0–100.0)
Monocytes Absolute: 1.9 10*3/uL — ABNORMAL HIGH (ref 0.1–1.0)
Platelets: 120 10*3/uL — ABNORMAL LOW (ref 150–400)
RDW: 14.2 % (ref 11.5–15.5)

## 2013-03-07 LAB — URINALYSIS, MICROSCOPIC ONLY
Bilirubin Urine: NEGATIVE
Glucose, UA: NEGATIVE mg/dL
Ketones, ur: 15 mg/dL — AB
Protein, ur: NEGATIVE mg/dL
Urobilinogen, UA: 0.2 mg/dL (ref 0.0–1.0)

## 2013-03-07 MED ORDER — COMPLETENATE 29-1 MG PO CHEW
1.0000 | CHEWABLE_TABLET | Freq: Every day | ORAL | Status: DC
  Administered 2013-03-07 – 2013-03-08 (×2): 1 via ORAL
  Filled 2013-03-07 (×3): qty 1

## 2013-03-07 NOTE — Progress Notes (Signed)
CTSP for epidural cath removal. Bleeding has ceased.  Site clean dry intact. Epidural removed. Tip intact. No complications.
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
## 2013-03-07 NOTE — Progress Notes (Signed)
Post Partum Day 1  Subjective: Tired. Has not tried to ambulate yet.  Objective: Blood pressure 107/73, pulse 139, temperature 98.4 F (36.9 C), temperature source Oral, resp. rate 20, height 5\' 2"  (1.575 m), weight 127 lb (57.607 kg), last menstrual period 05/31/2012, SpO2 100.00%, unknown if currently breastfeeding.  Physical Exam:  General: alert, cooperative and no distress Lochia: appropriate Uterine Fundus: firm Incision: NA DVT Evaluation: No evidence of DVT seen on physical exam.   Recent Labs  03/06/13 2115 03/07/13 0640  HGB 9.1* 7.0*  HCT 26.2* 20.3*    Assessment/Plan:  Anemia PP Hemorrhage. Resolved. Orthostatic BP's and pulses are OK. Will try to ambulate. Transfusion discussed. Risks and benefits reviewed. Declined for now. Continue antibiotics for 23 hours. Plan to discharge to home tomorrow.   LOS: 1 day   Derric Dealmeida V 03/07/2013, 10:08 AM

## 2013-03-08 MED ORDER — POLYSACCHARIDE IRON COMPLEX 150 MG PO CAPS: 150.0000 mg | ORAL_CAPSULE | Freq: Two times a day (BID) | ORAL | Status: DC

## 2013-03-08 MED ORDER — IBUPROFEN 100 MG/5ML PO SUSP: 600.0000 mg | Freq: Four times a day (QID) | ORAL | Status: DC

## 2013-03-08 NOTE — Progress Notes (Signed)
UR chart review completed.  

## 2013-03-08 NOTE — Lactation Note (Signed)
This note was copied from the chart of Kathleen Romero. Lactation Consultation Note Mom states br feeding is going very well, reports mild tenderness in nipples but "not too bad". States Statistician great without difficulty.  Reviewed prevention and treatment of engorgement and sore nipples. Enc mom to call lactation office if she has any concerns, and to attend the BFSG. Patient Name: Kathleen Romero [id number] Date: 03/08/2013     Maternal Data    Feeding Feeding Type: Breast Milk Feeding method: Breast  LATCH Score/Interventions Latch: Grasps breast easily, tongue down, lips flanged, rhythmical sucking.  Audible Swallowing: A few with stimulation Intervention(s): Skin to skin  Type of Nipple: Everted at rest and after stimulation  Comfort (Breast/Nipple): Soft / non-tender     Hold (Positioning): No assistance needed to correctly position infant at breast.  LATCH Score: 9  Lactation Tools Discussed/Used     Consult Status      Lenard Forth 03/08/2013, 9:41 AM

## 2013-03-09 NOTE — Discharge Summary (Signed)
  Vaginal Delivery Discharge Summary  Ernestyne Caldwell  DOB:    1985-11-26 MRN:    [id number] CSN:    [id number]  Date of admission:                  03/06/2013  Date of discharge:                   03/09/2013  Procedures this admission: SVD, labor epidural, manual removed of placental tissue, unasyn IVPB postpartum   Date of Delivery: 03/06/2013   Newborn Data:  Live born female  Birth Weight: 6 lb 8.9 oz (2974 g) APGAR: 9, 9  Home with mother.  Circumcision Plan: n/a   History of Present Illness:  Ms. Dorris Pierre is a 27 y.o. female, G1P1001, who presents at [redacted]w[redacted]d weeks gestation. The patient has been followed at the [facility] Gynecology division of [facility]. She was admitted onset of labor. Her pregnancy has been complicated by: none.  Hospital course:  The patient was admitted for labor.   Her labor was not complicated. She proceeded to have a vaginal delivery of a healthy infant. Her delivery was complicated, w PPH that required manual uterine exploration. Her postpartum course was complicated by severe PP anemia, hemodynamically stable  She was discharged to home on postpartum day 2 doing well.  Feeding:  breast  Contraception:  Depo-Provera  Discharge hemoglobin:  HGB  Date Value Range Status  03/02/2013 11.2* 11.6 - 15.9 g/dL Final     Hemoglobin  Date Value Range Status  03/07/2013 7.0* 12.0 - 15.0 g/dL Final     REPEATED TO VERIFY     DELTA CHECK NOTED     HCT  Date Value Range Status  03/07/2013 20.3* 36.0 - 46.0 % Final  03/02/2013 32.7* 34.8 - 46.6 % Final    Discharge Physical Exam:   General: no distress Lochia: appropriate Uterine Fundus: firm Incision: intact  DVT Evaluation: No evidence of DVT seen on physical exam. Negative Homan's sign. No significant calf/ankle edema.  Intrapartum Procedures: spontaneous vaginal delivery and manual uteruine expoloration after placenta delivery Postpartum  Procedures: none Complications-Operative and Postpartum: hemorrhage  Discharge Diagnoses: Term Pregnancy-delivered and anemia - stable  Discharge Information:  Activity:           pelvic rest Diet:                routine Medications: Ibuprofen and Iron Condition:      stable Instructions:  refer to practice specific booklet and printed instructions Discharge to: home  Follow-up Information   Follow up with [facility] In 6 weeks.   Contact information:   [address] [place] [address] [phone]       Malissa Hippo 03/09/2013

## 2013-03-09 NOTE — Anesthesia Postprocedure Evaluation (Signed)
Patient stable following vaginal delivery.  

## 2013-03-10 LAB — TYPE AND SCREEN
ABO/RH(D): O POS
Antibody Screen: NEGATIVE
Unit division: 0

## 2013-03-15 ENCOUNTER — Other Ambulatory Visit: Payer: Medicaid Other | Admitting: Lab

## 2013-03-16 ENCOUNTER — Other Ambulatory Visit: Payer: Self-pay

## 2013-03-16 DIAGNOSIS — D696 Thrombocytopenia, unspecified: Secondary | ICD-10-CM

## 2013-04-06 ENCOUNTER — Ambulatory Visit (HOSPITAL_COMMUNITY): Payer: Medicaid Other

## 2013-05-10 ENCOUNTER — Other Ambulatory Visit: Payer: Medicaid Other

## 2013-12-31 ENCOUNTER — Emergency Department (HOSPITAL_COMMUNITY)
Admission: EM | Admit: 2013-12-31 | Discharge: 2013-12-31 | Disposition: A | Discharge status: Home / Self Care | Payer: Medicaid Other | Source: Home / Self Care | Attending: Family Medicine | Admitting: Family Medicine

## 2013-12-31 ENCOUNTER — Encounter (HOSPITAL_COMMUNITY): Payer: Self-pay | Admitting: Emergency Medicine

## 2013-12-31 DIAGNOSIS — R0982 Postnasal drip: Secondary | ICD-10-CM

## 2013-12-31 DIAGNOSIS — H9319 Tinnitus, unspecified ear: Secondary | ICD-10-CM

## 2013-12-31 MED ORDER — PREDNISOLONE SODIUM PHOSPHATE 15 MG/5ML PO SOLN: 30.0000 mg | Freq: Every day | ORAL | Status: AC

## 2013-12-31 MED ORDER — IPRATROPIUM BROMIDE 0.06 % NA SOLN: 2.0000 | Freq: Four times a day (QID) | NASAL | Status: DC

## 2013-12-31 NOTE — ED Notes (Signed)
C/o  Postnasal drip.  Runny nose.  Ringing/soreness in ears.  Denies fever, n/v/d.  Pt has not tried any otc meds for symptoms.  Symptoms present x 4 days.

## 2013-12-31 NOTE — ED Provider Notes (Signed)
Kathleen Romero is a 28 y.o. female who presents to Urgent Care today for runny nose. Patient has run in his postnasal drip and ringing in her right ear. She also has a mild sore throat. She notes that at this present for several months now. This is somewhat bothersome. She denies any dizziness weakness or difficulty with hearing. She is well otherwise.   Past Medical History  Diagnosis Date  . No pertinent past medical history   . Infection     Yeast;was treated 08/15/12 w/ Monistat 7  . Infection     BV x 2   History  Substance Use Topics  . Smoking status: Former Smoker -- 0.25 packs/day for 12 years    Types: Cigarettes, Cigars    Quit date: 07/20/2012  . Smokeless tobacco: Never Used  . Alcohol Use: No     Comment: Rarely   ROS as above Medications: No current facility-administered medications for this encounter.   Current Outpatient Prescriptions  Medication Sig Dispense Refill  . ibuprofen (ADVIL,MOTRIN) 100 MG/5ML suspension Take 30 mLs (600 mg total) by mouth every 6 (six) hours.  473 mL  0  . ipratropium (ATROVENT) 0.06 % nasal spray Place 2 sprays into both nostrils 4 (four) times daily.  15 mL  1  . iron polysaccharides (NIFEREX) 150 MG capsule Take 1 capsule (150 mg total) by mouth 2 (two) times daily.  60 capsule  2  . prednisoLONE (ORAPRED) 15 MG/5ML solution Take 10 mLs (30 mg total) by mouth daily. 5 days  100 mL  0  . Prenatal Vit-Fe Fumarate-FA (PRENATAL MULTIVITAMIN) TABS Take 1 tablet by mouth daily at 12 noon.        Exam:  BP 121/76  Pulse 70  Temp(Src) 97.9 F (36.6 C) (Oral)  Resp 16  SpO2 100%  Breastfeeding? No Gen: Well NAD HEENT: EOMI,  MMM normal tympanic membranes bilaterally. Posterior pharynx with cobblestoning. Lungs: Normal work of breathing. CTABL Heart: RRR no MRG Abd: NABS, Soft. NT, ND Exts: Brisk capillary refill, warm and well perfused.   No results found for this or any previous visit (from the past 24 hour(s)). No results  found.  Assessment and Plan: 28 y.o. female with viral pharyngitis. This is somewhat symptomatic. Will treat with Atrovent nasal spray and Orapred. Unclear etiology for tinnitus. [facility]  Discussed warning signs or symptoms. Please see discharge instructions. Patient expresses understanding.    Rodolph BongEvan S Ruqayya Ventress, MD 12/31/13 [phone]

## 2013-12-31 NOTE — Discharge Instructions (Signed)
Thank you for coming in today. Make an appointment with [facility].  Use atrovent nasal spray  Take orapred daily for 5 days.  Come back as needed.  Tinnitus Sounds you hear in your ears and coming from within the ear is called tinnitus. This can be a symptom of many ear disorders. It is often associated with hearing loss.  Tinnitus can be seen with:  Infections.  Ear blockages such as wax buildup.  Meniere's disease.  Ear damage.  Inherited.  Occupational causes. While irritating, it is not usually a threat to health. When the cause of the tinnitus is wax, infection in the middle ear, or foreign body it is easily treated. Hearing loss will usually be reversible.  TREATMENT  When treating the underlying cause does not get rid of tinnitus, it may be necessary to get rid of the unwanted sound by covering it up with more pleasant background noises. This may include music, the radio etc. There are tinnitus maskers which can be worn which produce background noise to cover up the tinnitus. Avoid all medications which tend to make tinnitus worse such as alcohol, caffeine, aspirin, and nicotine. There are many soothing background tapes such as rain, ocean, thunderstorms, etc. These soothing sounds help with sleeping or resting. Keep all follow-up appointments and referrals. This is important to identify the cause of the problem. It also helps avoid complications, impaired hearing, disability, or chronic pain. Document Released: 11/04/2005 Document Revised: 01/27/2012 Document Reviewed: 06/22/2008 [facility] Patient Information 2014 [employer] [place] [employer] [place].

## 2014-09-19 ENCOUNTER — Encounter (HOSPITAL_COMMUNITY): Payer: Self-pay | Admitting: Emergency Medicine

## 2015-05-23 ENCOUNTER — Ambulatory Visit
Admission: RE | Admit: 2015-05-23 | Discharge: 2015-05-23 | Disposition: A | Discharge status: Home / Self Care | Payer: Self-pay | Source: Ambulatory Visit | Attending: Nurse Practitioner | Admitting: Nurse Practitioner

## 2015-05-23 ENCOUNTER — Other Ambulatory Visit: Payer: Self-pay | Admitting: Nurse Practitioner

## 2015-05-23 DIAGNOSIS — M25552 Pain in left hip: Principal | ICD-10-CM

## 2015-05-23 DIAGNOSIS — M25551 Pain in right hip: Secondary | ICD-10-CM | POA: Insufficient documentation

## 2015-05-23 DIAGNOSIS — M5136 Other intervertebral disc degeneration, lumbar region: Secondary | ICD-10-CM | POA: Insufficient documentation

## 2015-05-23 DIAGNOSIS — M545 Low back pain, unspecified: Secondary | ICD-10-CM

## 2015-06-03 ENCOUNTER — Emergency Department
Admission: EM | Admit: 2015-06-03 | Discharge: 2015-06-03 | Disposition: A | Discharge status: Home / Self Care | Payer: Medicaid Other | Attending: Emergency Medicine | Admitting: Emergency Medicine

## 2015-06-03 ENCOUNTER — Encounter: Payer: Self-pay | Admitting: Emergency Medicine

## 2015-06-03 DIAGNOSIS — R110 Nausea: Secondary | ICD-10-CM | POA: Insufficient documentation

## 2015-06-03 DIAGNOSIS — Z79899 Other long term (current) drug therapy: Secondary | ICD-10-CM | POA: Insufficient documentation

## 2015-06-03 DIAGNOSIS — Z3202 Encounter for pregnancy test, result negative: Secondary | ICD-10-CM | POA: Insufficient documentation

## 2015-06-03 DIAGNOSIS — T50905A Adverse effect of unspecified drugs, medicaments and biological substances, initial encounter: Secondary | ICD-10-CM

## 2015-06-03 DIAGNOSIS — Z87891 Personal history of nicotine dependence: Secondary | ICD-10-CM | POA: Insufficient documentation

## 2015-06-03 DIAGNOSIS — T498X5A Adverse effect of other topical agents, initial encounter: Secondary | ICD-10-CM | POA: Insufficient documentation

## 2015-06-03 DIAGNOSIS — R102 Pelvic and perineal pain: Secondary | ICD-10-CM | POA: Insufficient documentation

## 2015-06-03 LAB — POCT PREGNANCY, URINE: Preg Test, Ur: NEGATIVE

## 2015-06-03 NOTE — ED Notes (Signed)
Pt given discharge instructions. Upset that er will not change her to a new birth control. Suggested she can take pepcid or maalox when her stomach gets upset from her prescribed pills. Still upset saying she wasted her time here.

## 2015-06-03 NOTE — ED Notes (Signed)
Pt states that she is having a reaction to the birth control pain that she was placed on last week. Pt reports lower abd pain and bad taste in mouth. Pt states she was getting the injections for birth control before. Denies vaginal discharge.

## 2015-06-03 NOTE — ED Provider Notes (Signed)
CSN: [id number]     Arrival date & time 06/03/15  1244 History   First MD Initiated Contact with Patient 06/03/15 1409     Chief Complaint  Patient presents with  . Medication Reaction    HPI Comments: 29 year old female presents today complaining of adverse reaction to her birth control. Pt states that she was switched from Depo injections to a birth control patch approximately a week ago by her primary care provider. Since then she has had some GI upset, pelvic cramping and a bad taste in her mouth. She has continued to use the patch despite these side effects. She has not had a period recently due to the Depo shot. She was unaware she could not smoke cigarettes while using the patch so she has also abruptly stopped smoking   Patient is a 29 y.o. female presenting with allergic reaction. The history is provided by the patient.  Allergic Reaction   Past Medical History  Diagnosis Date  . No pertinent past medical history   . Infection     Yeast;was treated 08/15/12 w/ Monistat 7  . Infection     BV x 2   Past Surgical History  Procedure Laterality Date  . No past surgeries     Family History  Problem Relation Age of Onset  . Hypertension Mother   . Hypertension Father     deceased  . Other Other     Huntington's Chorea;MGF, M. Uncle(deceased);M. Aunt (showing signs of)  . Asthma Paternal Uncle     x 2  . Alcohol abuse Father    History  Substance Use Topics  . Smoking status: Former Smoker -- 0.25 packs/day for 12 years    Types: Cigarettes, Cigars    Quit date: 07/20/2012  . Smokeless tobacco: Never Used  . Alcohol Use: No     Comment: Rarely   OB History    Gravida Para Term Preterm AB TAB SAB Ectopic Multiple Living   1 1 1       1      Review of Systems  Gastrointestinal: Positive for nausea.  Genitourinary: Positive for pelvic pain. Negative for urgency, decreased urine volume, vaginal bleeding and vaginal discharge.  Skin: Negative for wound.  All other  systems reviewed and are negative.     Allergies  Review of patient's allergies indicates no known allergies.  Home Medications   Prior to Admission medications   Medication Sig Start Date End Date Taking? Authorizing Provider  ibuprofen (ADVIL,MOTRIN) 100 MG/5ML suspension Take 30 mLs (600 mg total) by mouth every 6 (six) hours. 03/08/13   Sanda KleinShelley Lillard, CNM  ipratropium (ATROVENT) 0.06 % nasal spray Place 2 sprays into both nostrils 4 (four) times daily. 12/31/13   Rodolph BongEvan S Corey, MD  iron polysaccharides (NIFEREX) 150 MG capsule Take 1 capsule (150 mg total) by mouth 2 (two) times daily. 03/08/13   Sanda KleinShelley Lillard, CNM  Prenatal Vit-Fe Fumarate-FA (PRENATAL MULTIVITAMIN) TABS Take 1 tablet by mouth daily at 12 noon.    Historical Provider, MD   BP 111/61 mmHg  Pulse 75  Temp(Src) 98.6 F (37 C) (Oral)  Ht 5\' 2"  (1.575 m)  Wt 92 lb (41.731 kg)  BMI 16.82 kg/m2  SpO2 99%  LMP 04/26/2015 (Approximate) Physical Exam  Constitutional: She is oriented to person, place, and time. Vital signs are normal. She appears well-developed and well-nourished.  HENT:  Head: Normocephalic and atraumatic.  Neck: Normal range of motion. Neck supple.  Cardiovascular: Normal rate,  regular rhythm, normal heart sounds and intact distal pulses.  Exam reveals no gallop and no friction rub.   No murmur heard. Pulmonary/Chest: Effort normal and breath sounds normal. No respiratory distress. She has no wheezes. She has no rales.  Abdominal: Soft. Bowel sounds are normal. She exhibits no distension. There is no tenderness. There is no rebound and no guarding.  Neurological: She is alert and oriented to person, place, and time.  Skin: Skin is warm and dry.  Psychiatric: She has a normal mood and affect. Her behavior is normal. Judgment and thought content normal.  Nursing note and vitals reviewed.   ED Course  Procedures (including critical care time) Labs Review Labs Reviewed  POCT PREGNANCY, URINE     Imaging Review No results found.   EKG Interpretation None      MDM  Discussed with patient it is normal to experience some of these side effects when switching hormonal treatments. Upreg is negative. Advised she can take off the patch and not put another one on if she desires, but she may experience vaginal bleeding Final diagnoses:  Medication reaction, initial encounter       Luvenia Redden, PA-C 06/03/15 1524  Wilber Oliphant V, PA-C 06/03/15 1525  Jene Every, MD 06/03/15 1616

## 2015-06-03 NOTE — ED Notes (Signed)
Patient to ED with c/o multiple symptoms that she is attributing to new birth control that she started last week. Reports she has read that some of the symptoms she is having are probably related to new medication and did not want to wait until Monday to see her MD.

## 2016-06-01 IMAGING — CR DG HIP (WITH OR WITHOUT PELVIS) 3-4V BILAT
1 series · 5 of 5 positions shown · non-contrast
Comparison: None.

CLINICAL DATA: Chronic right hip pain for 2 years without known
injury. Initial encounter.

EXAM:
BILATERAL HIP (WITH PELVIS) 3-4 VIEWS

[Series 1: view not recorded · 0.14mm/px · 5 of 5 slices shown]
[im 1/5]
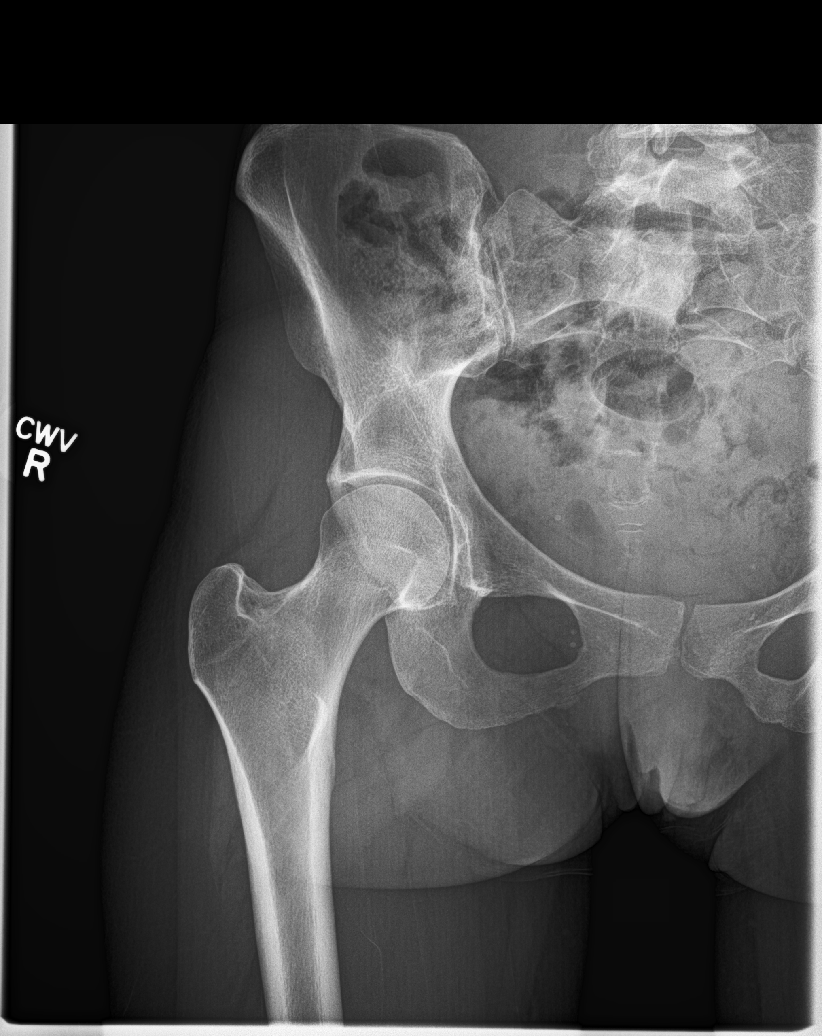
[im 2/5]
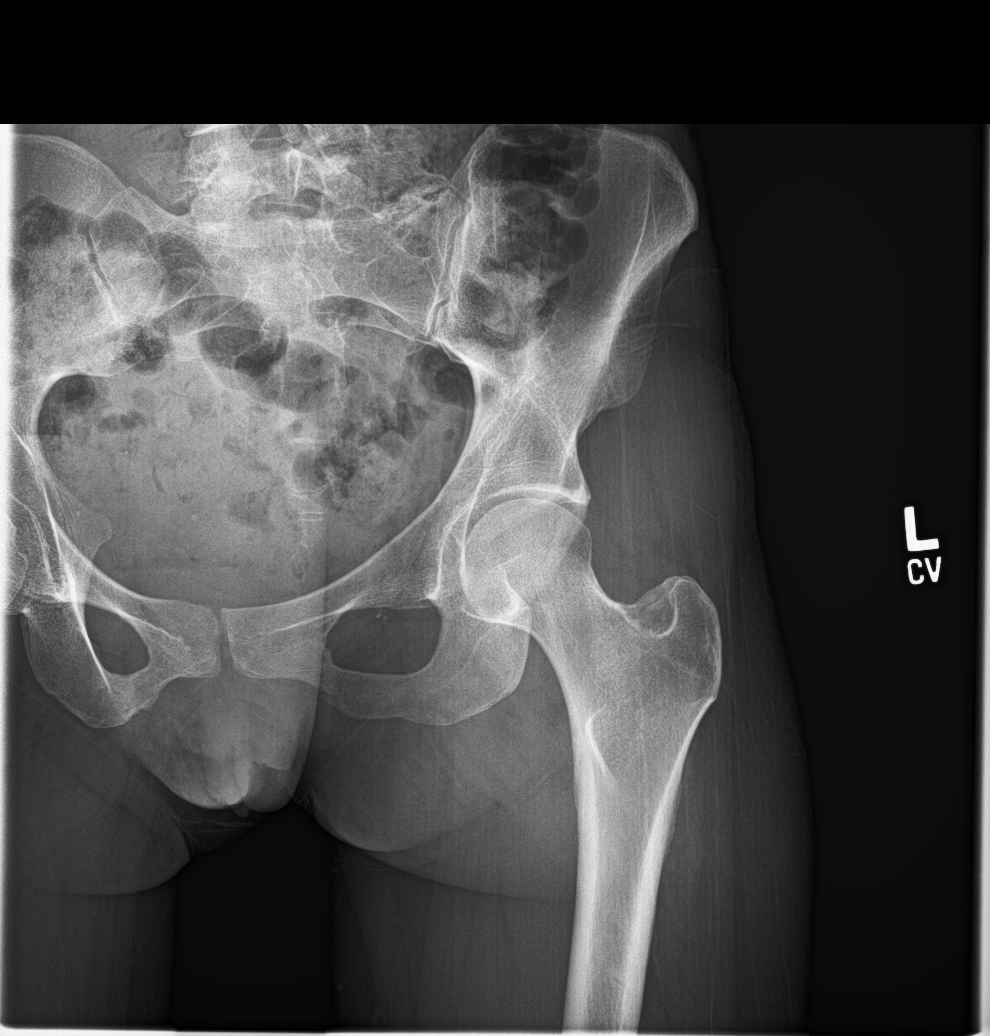
[im 3/5]
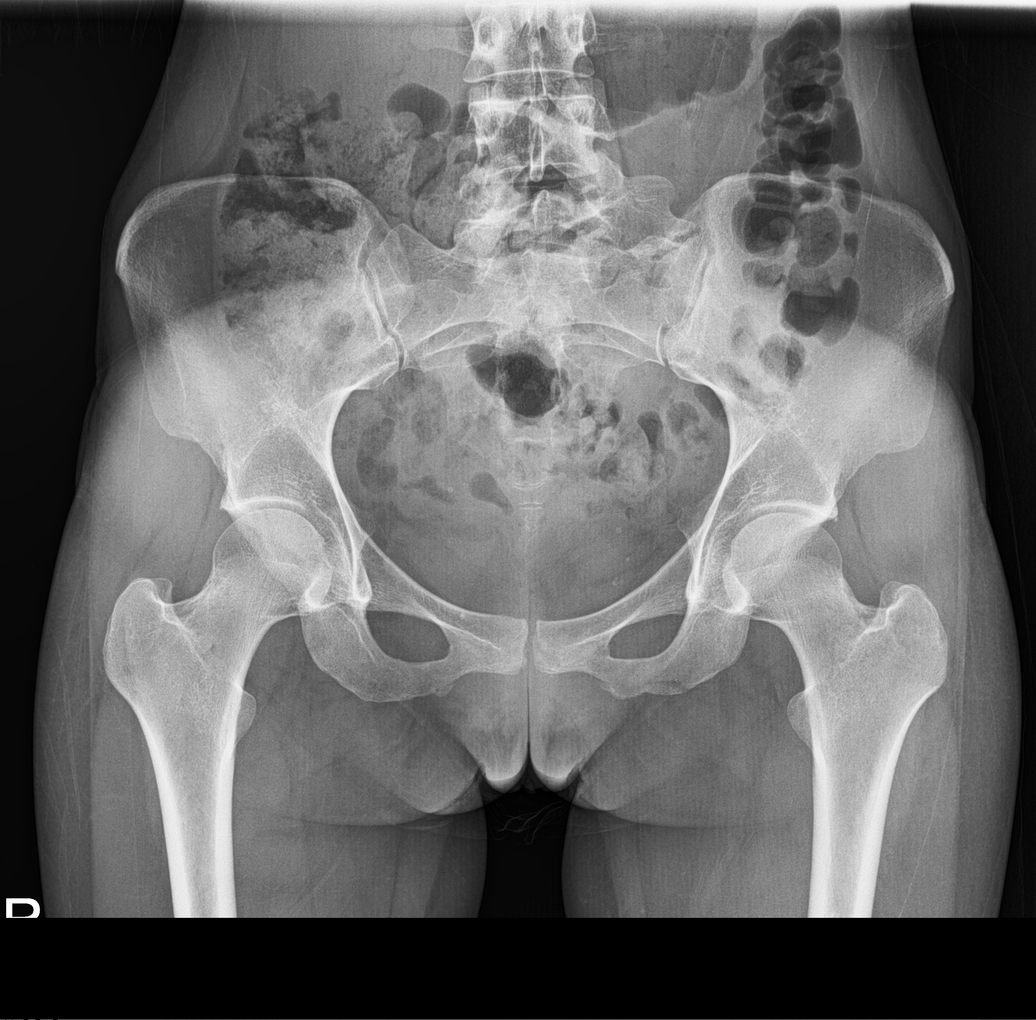
[im 4/5]
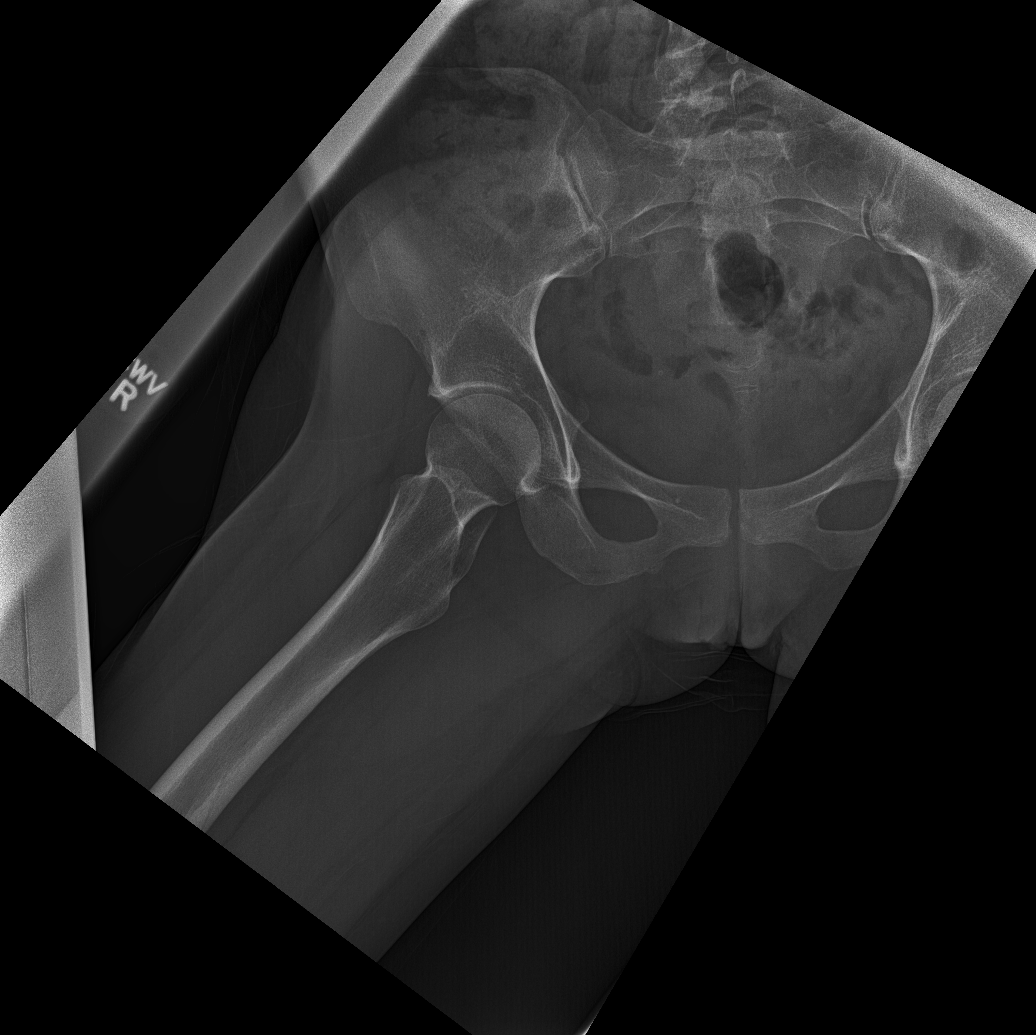
[im 5/5]
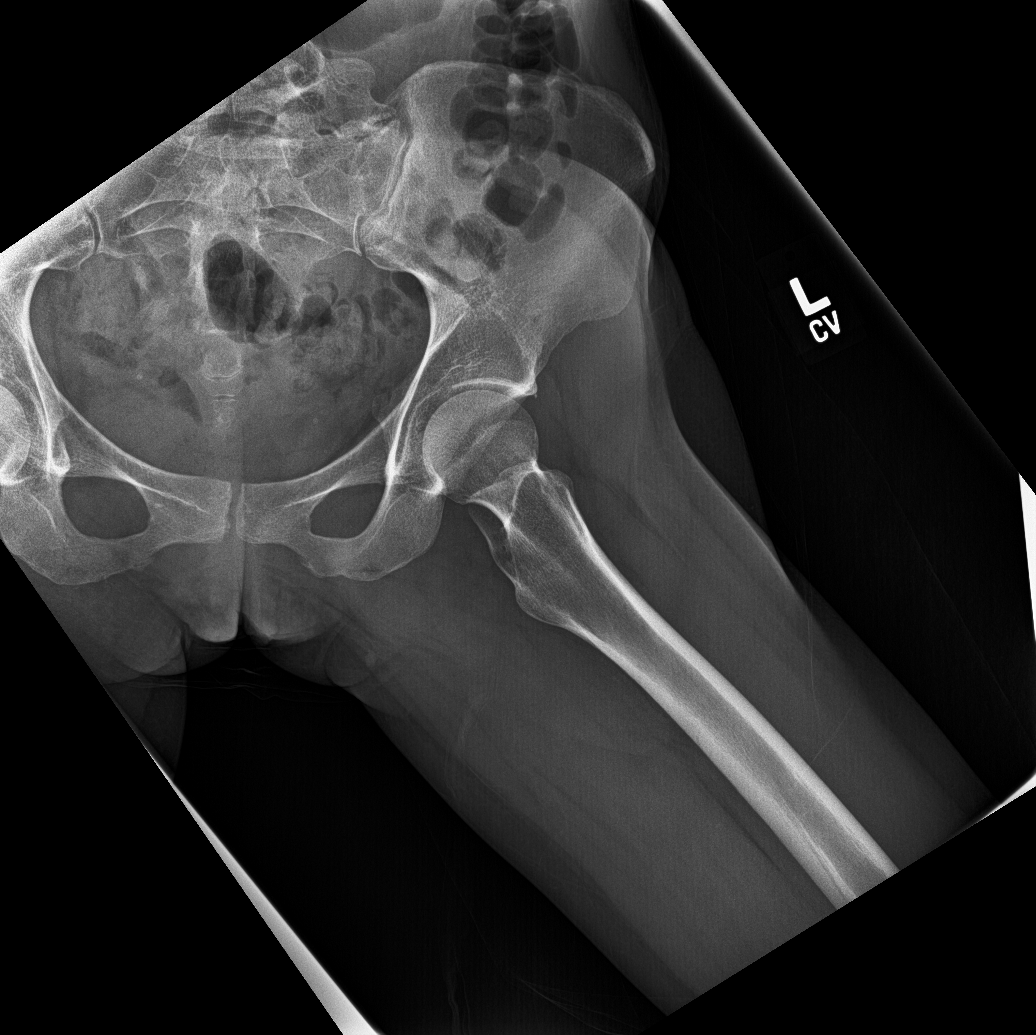

[5 of 5 positions shown; findings below may reference images not displayed]

FINDINGS: There is no evidence of hip fracture or dislocation. There is no
evidence of arthropathy or other focal bone abnormality.
IMPRESSION: Normal bilateral hips.

## 2016-06-01 IMAGING — CR DG LUMBAR SPINE COMPLETE 4+V
1 series · 5 of 5 positions shown · non-contrast
Comparison: None.

CLINICAL DATA: Low back pain for 2 years.  No known injury.

EXAM:
LUMBAR SPINE - COMPLETE 4+ VIEW

[Series 1: dg lumbar spine complete 4 +v · 0.14mm/px · 5 of 5 slices shown]
[im 1/5]
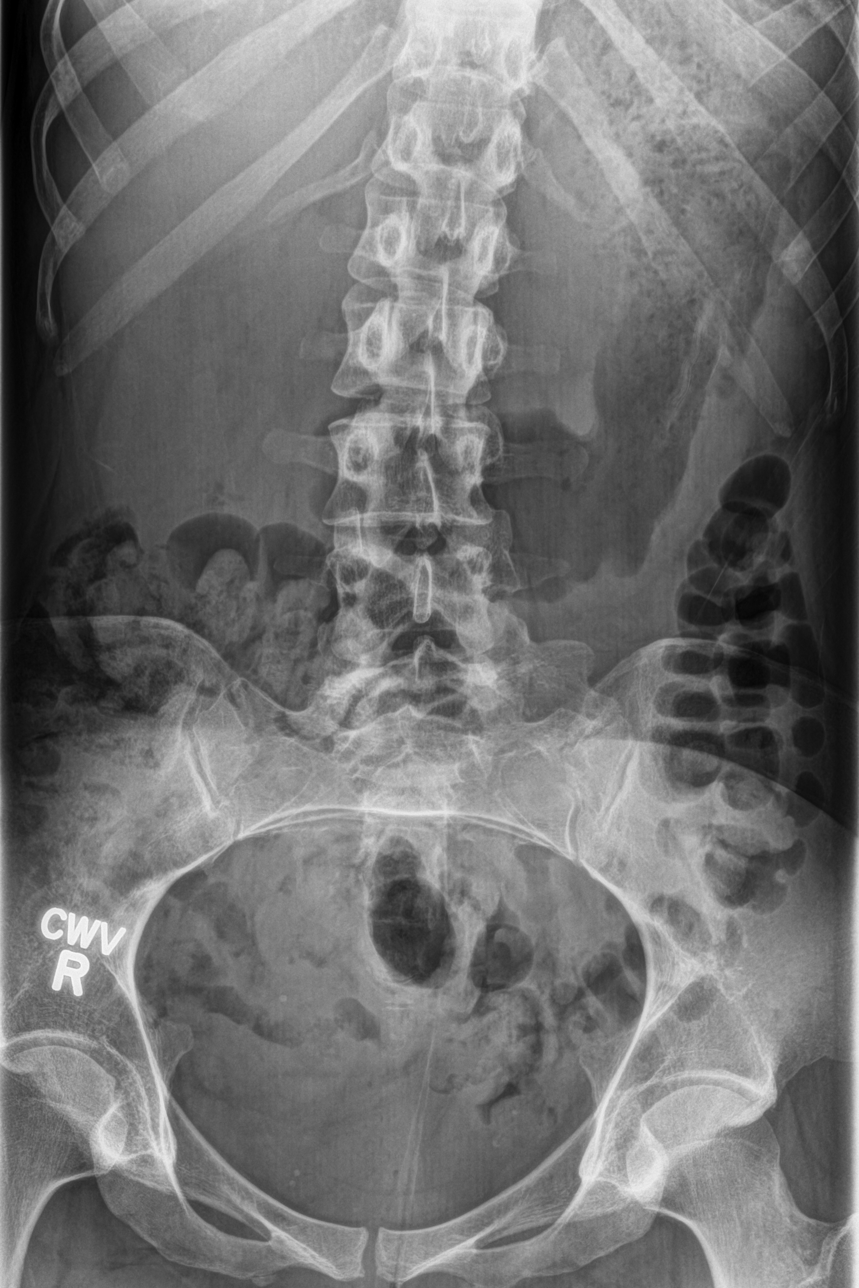
[im 2/5]
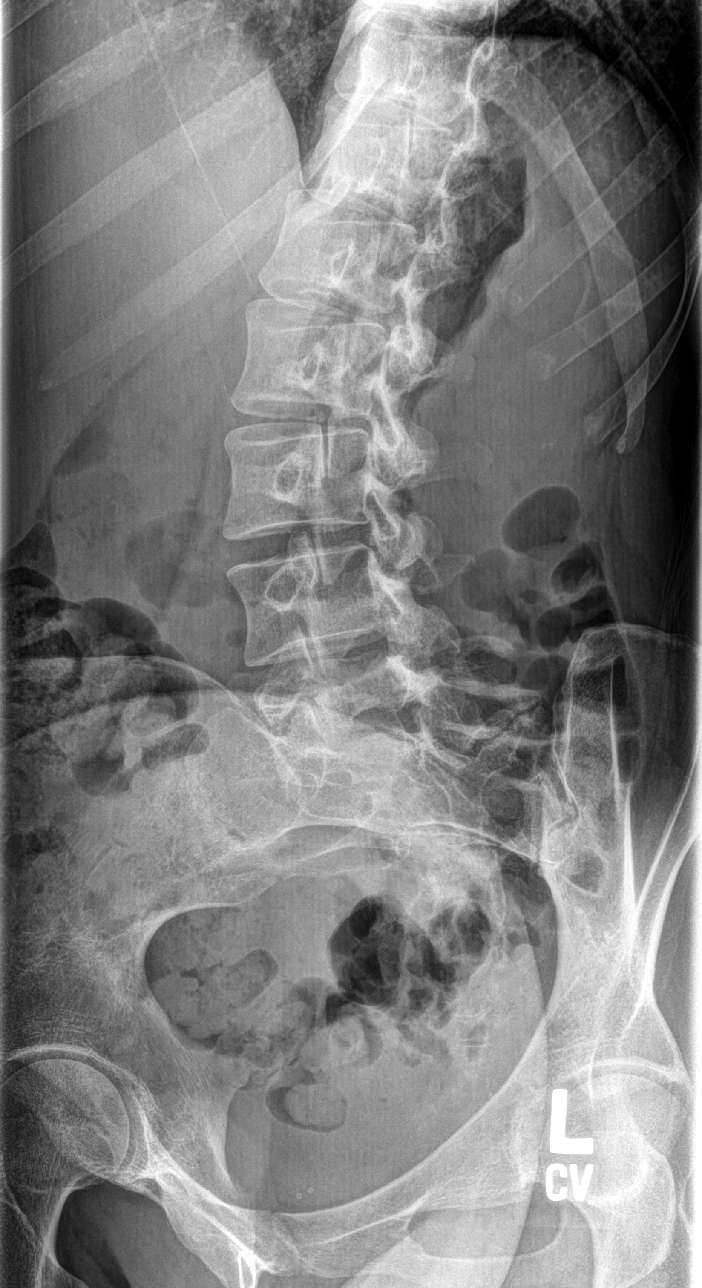
[im 3/5]
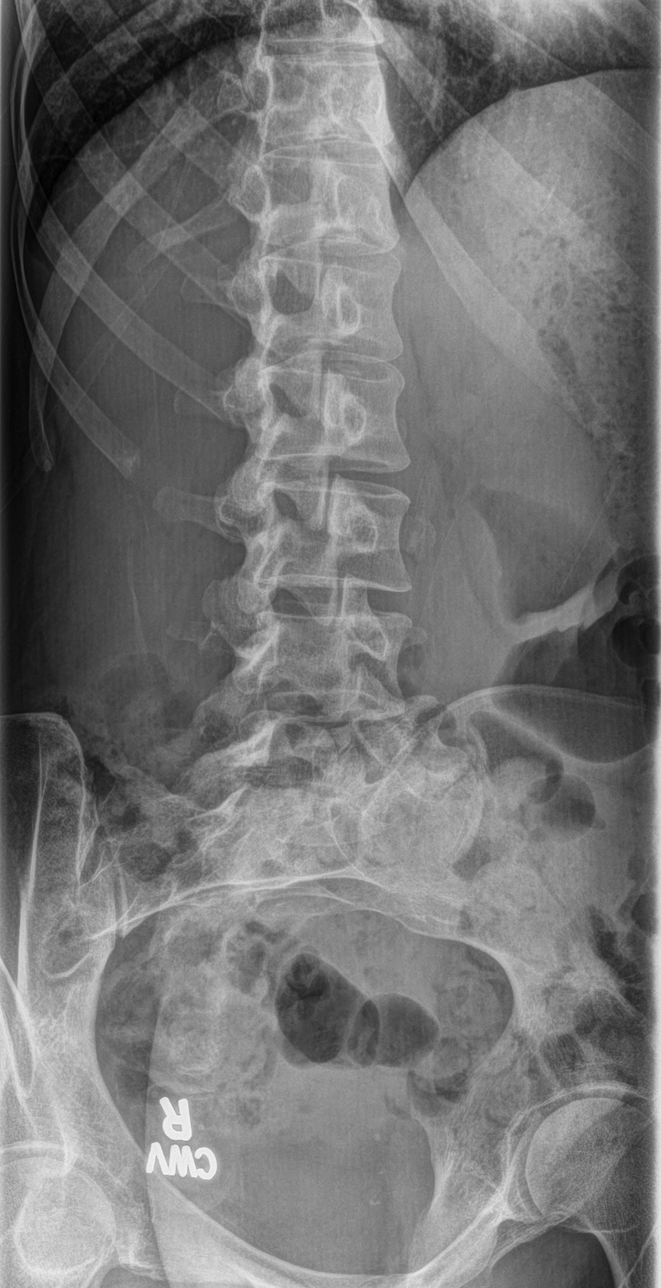
[im 4/5]
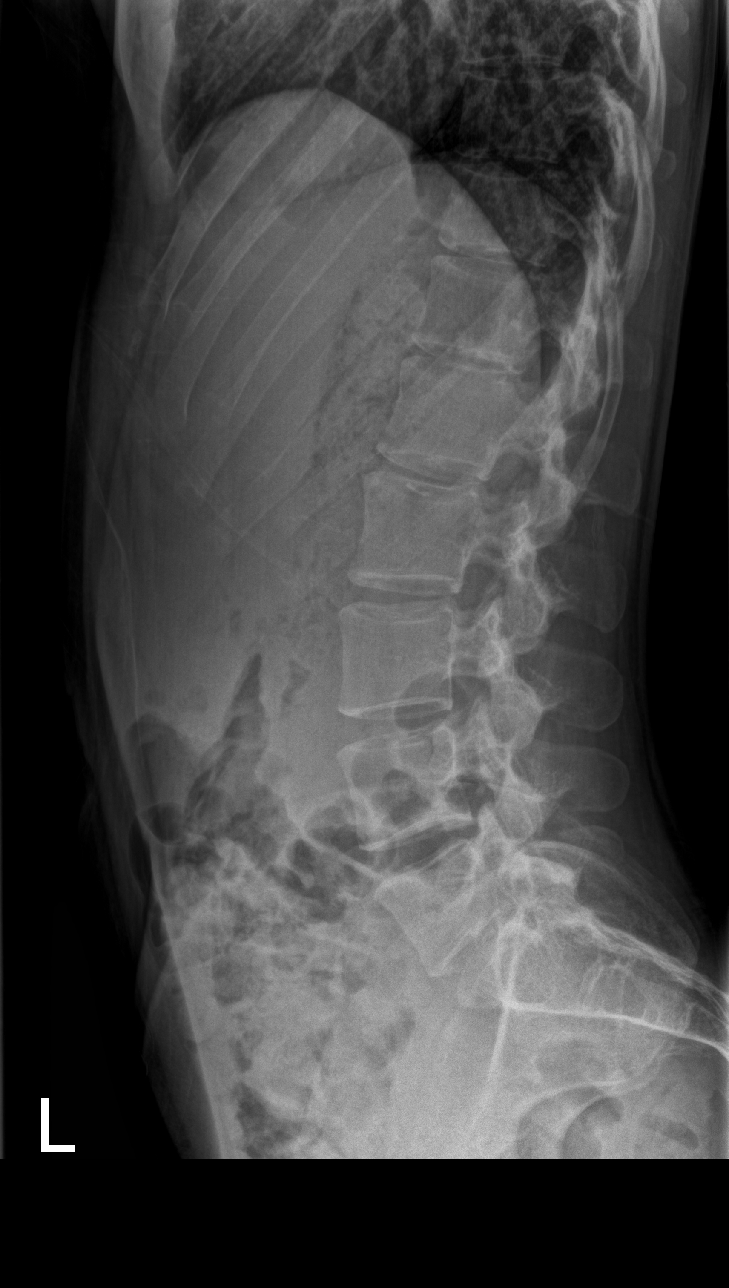
[im 5/5]
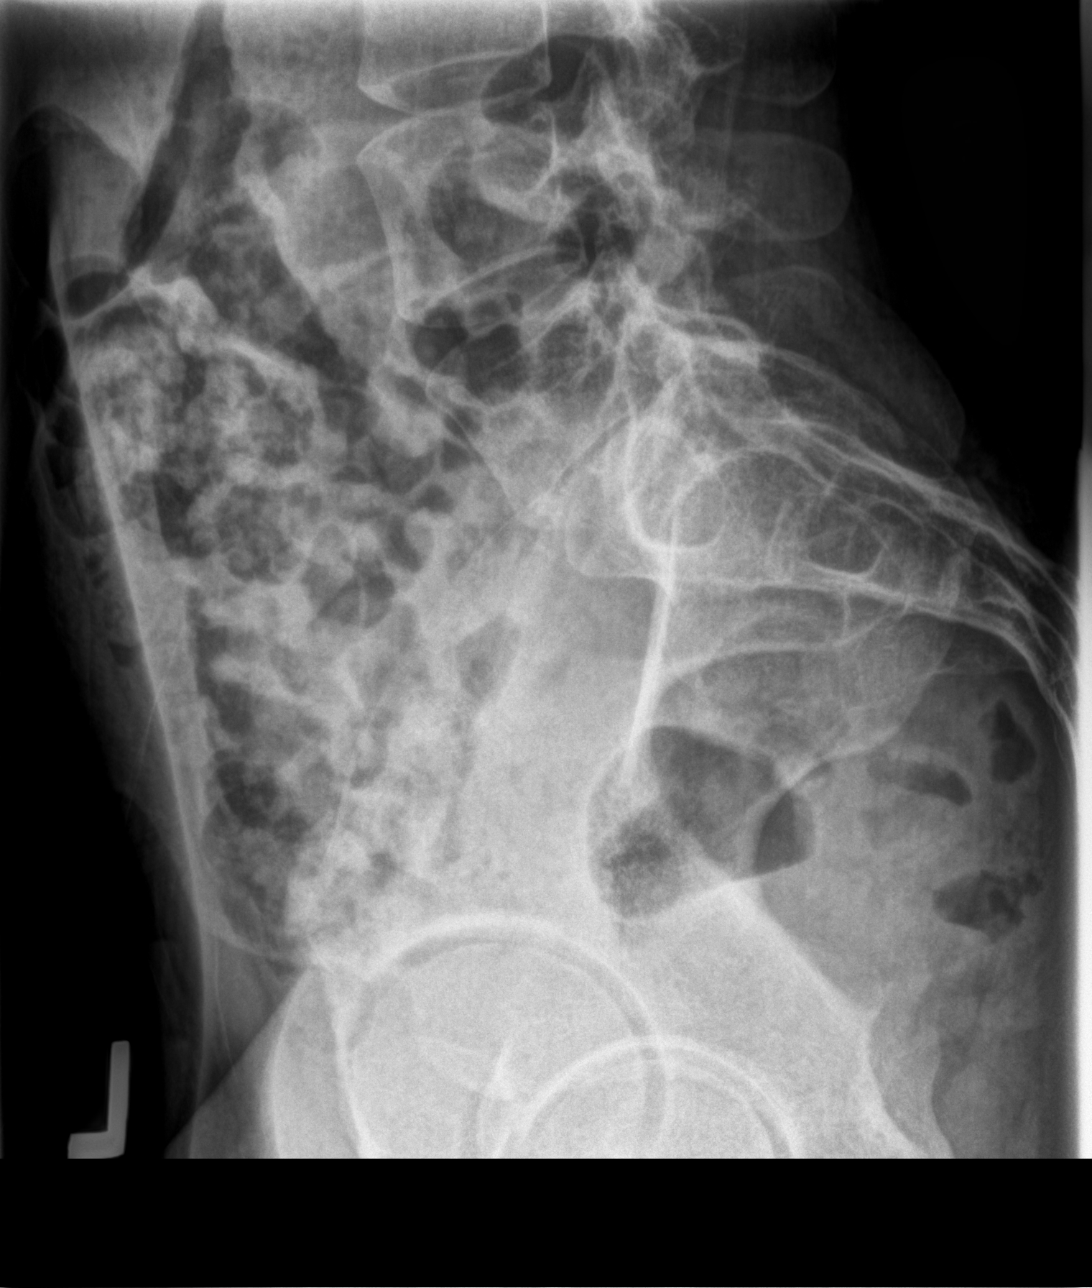

[5 of 5 positions shown; findings below may reference images not displayed]

FINDINGS: Paraspinal soft tissues are normal. Mild scoliosis concave left.
Multilevel degenerative change. Spondylolysis at L5 cannot be
excluded. No acute abnormality. Pelvic calcifications consistent
phleboliths.
IMPRESSION: Mild scoliosis concave left. Mild diffuse degenerative change.
Spondylolysis cannot be excluded at L5. No spondylolisthesis. No
acute abnormality identified .

## 2018-10-19 DIAGNOSIS — Z0389 Encounter for observation for other suspected diseases and conditions ruled out: Secondary | ICD-10-CM | POA: Diagnosis not present

## 2018-10-19 DIAGNOSIS — Z30013 Encounter for initial prescription of injectable contraceptive: Secondary | ICD-10-CM | POA: Diagnosis not present

## 2018-10-19 DIAGNOSIS — Z3009 Encounter for other general counseling and advice on contraception: Secondary | ICD-10-CM | POA: Diagnosis not present

## 2018-10-19 DIAGNOSIS — Z309 Encounter for contraceptive management, unspecified: Secondary | ICD-10-CM | POA: Diagnosis not present

## 2018-10-19 DIAGNOSIS — R87612 Low grade squamous intraepithelial lesion on cytologic smear of cervix (LGSIL): Secondary | ICD-10-CM | POA: Diagnosis not present

## 2018-10-19 DIAGNOSIS — Z1388 Encounter for screening for disorder due to exposure to contaminants: Secondary | ICD-10-CM | POA: Diagnosis not present

## 2018-10-19 LAB — HM HIV SCREENING LAB: HM HIV Screening: NEGATIVE

## 2018-10-19 LAB — HM PAP SMEAR: HM Pap smear: POSITIVE

## 2018-12-11 ENCOUNTER — Ambulatory Visit (INDEPENDENT_AMBULATORY_CARE_PROVIDER_SITE_OTHER): Payer: BLUE CROSS/BLUE SHIELD | Admitting: Obstetrics and Gynecology

## 2018-12-11 ENCOUNTER — Encounter: Payer: Self-pay | Admitting: Obstetrics and Gynecology

## 2018-12-11 ENCOUNTER — Other Ambulatory Visit (HOSPITAL_COMMUNITY)
Admission: RE | Admit: 2018-12-11 | Discharge: 2018-12-11 | Disposition: A | Discharge status: Home / Self Care | Payer: BLUE CROSS/BLUE SHIELD | Source: Ambulatory Visit | Attending: Obstetrics and Gynecology | Admitting: Obstetrics and Gynecology

## 2018-12-11 VITALS — BP 114/76 | HR 84 | Ht 62.0 in | Wt 104.4 lb

## 2018-12-11 DIAGNOSIS — R87612 Low grade squamous intraepithelial lesion on cytologic smear of cervix (LGSIL): Secondary | ICD-10-CM | POA: Insufficient documentation

## 2018-12-11 NOTE — Progress Notes (Signed)
Pt is present today for colpo. Pt stated that she is doing well no complaints.

## 2018-12-11 NOTE — Patient Instructions (Signed)
Colposcopy, Care After  This sheet gives you information about how to care for yourself after your procedure. Your health care provider may also give you more specific instructions. If you have problems or questions, contact your health care provider.  What can I expect after the procedure?  If you had a colposcopy without a biopsy, you can expect to feel fine right away, but you may have some spotting for a few days. You can go back to your normal activities.  If you had a colposcopy with a biopsy, it is common to have:   Soreness and pain. This may last for a few days.   Light-headedness.   Mild vaginal bleeding or dark-colored, grainy discharge. This may last for a few days. The discharge may be due to a solution that was used during the procedure. You may need to wear a sanitary pad during this time.   Spotting for at least 48 hours after the procedure.  Follow these instructions at home:     Take over-the-counter and prescription medicines only as told by your health care provider. Talk with your health care provider about what type of over-the-counter pain medicine and prescription medicine you can start taking again. It is especially important to talk with your health care provider if you take blood-thinning medicine.   Do not drive or use heavy machinery while taking prescription pain medicine.   For at least 3 days after your procedure, or as long as told by your health care provider, avoid:  ? Douching.  ? Using tampons.  ? Having sexual intercourse.   Continue to use birth control (contraception).   Limit your physical activity for the first day after the procedure as told by your health care provider. Ask your health care provider what activities are safe for you.   It is up to you to get the results of your procedure. Ask your health care provider, or the department performing the procedure, when your results will be ready.   Keep all follow-up visits as told by your health care provider.  This is important.  Contact a health care provider if:   You develop a skin rash.  Get help right away if:   You are bleeding heavily from your vagina or you are passing blood clots. This includes using more than one sanitary pad per hour for 2 hours in a row.   You have a fever or chills.   You have pelvic pain.   You have abnormal, yellow-colored, or bad-smelling vaginal discharge. This could be a sign of infection.   You have severe pain or cramps in your lower abdomen that do not get better with medicine.   You feel light-headed or dizzy, or you faint.  Summary   If you had a colposcopy without a biopsy, you can expect to feel fine right away, but you may have some spotting for a few days. You can go back to your normal activities.   If you had a colposcopy with a biopsy, you may notice mild pain and spotting for 48 hours after the procedure.   Avoid douching, using tampons, and having sexual intercourse for 3 days after the procedure or as long as told by your health care provider.   Contact your health care provider if you have bleeding, severe pain, or signs of infection.  This information is not intended to replace advice given to you by your health care provider. Make sure you discuss any questions you have with your   health care provider.  Document Released: 08/25/2013 Document Revised: 06/21/2016 Document Reviewed: 06/21/2016  Elsevier Interactive Patient Education  2019 [employer] Inc.

## 2018-12-11 NOTE — Progress Notes (Signed)
    GYNECOLOGY CLINIC COLPOSCOPY PROCEDURE NOTE  33 y.o. G1P1001 here for colposcopy for low-grade squamous intraepithelial neoplasia (LGSIL - encompassing HPV,mild dysplasia,CIN I) pap smear on 10/19/2018. Discussed role for HPV in cervical dysplasia, need for surveillance.  She reports a remote history of abnormal pap smear in the past followed by normal colposcopy. Also notes she has recently been treated for a yeast infection.   Patient given informed consent, signed copy in the chart, time out was performed.  Placed in lithotomy position. Cervix viewed with speculum and colposcope after application of acetic acid.   Colposcopy adequate? Yes  HPV changes noted at 12 and 6 o'clock; corresponding biopsies obtained.  ECC specimen obtained. All specimens were labeled and sent to pathology.   Patient was given post procedure instructions.  Will follow up pathology and manage accordingly; patient will be contacted with results and recommendations.  Routine preventative health maintenance measures emphasized.    Hildred Laser, MD [facility]

## 2018-12-11 NOTE — Addendum Note (Signed)
Addended by: Silvano Bilis on: 12/11/2018 11:54 AM   Modules accepted: Orders

## 2018-12-14 ENCOUNTER — Telehealth: Payer: Self-pay | Admitting: Obstetrics and Gynecology

## 2018-12-14 NOTE — Telephone Encounter (Signed)
Please advise 

## 2018-12-14 NOTE — Telephone Encounter (Signed)
It is more than likely a mixture of the blood and medication used to help stop bleeding that can make for a clumpy discharge and can look like tissue. It should clear in a few days. If it doesn't, she will need to return for further evaluation

## 2018-12-14 NOTE — Telephone Encounter (Signed)
The patient called and stated that she has a few questions for a nurse. The patient is wanting to know if it is normal to have tissue discharge after having a colposcopy. The patient did not disclose any other information. Please advise.

## 2018-12-15 NOTE — Telephone Encounter (Signed)
LM for patient to return call.

## 2018-12-17 NOTE — Telephone Encounter (Signed)
Notified patient of message. Patient is not having discharge any longer.

## 2018-12-21 ENCOUNTER — Telehealth: Payer: Self-pay | Admitting: Obstetrics and Gynecology

## 2018-12-21 NOTE — Telephone Encounter (Signed)
Becky, from the health department, wanted to know if this patient would be contacted and how would her follow up be handled so she will know what she needs to do as far as her following up with the patient as well.  Please advise, thanks.

## 2018-12-25 NOTE — Telephone Encounter (Signed)
Kathleen Romero was called no answer LM to call the office to speak more concerning the pt.

## 2019-01-04 DIAGNOSIS — Z309 Encounter for contraceptive management, unspecified: Secondary | ICD-10-CM | POA: Diagnosis not present

## 2019-01-04 DIAGNOSIS — Z719 Counseling, unspecified: Secondary | ICD-10-CM | POA: Diagnosis not present

## 2019-01-04 DIAGNOSIS — Z538 Procedure and treatment not carried out for other reasons: Secondary | ICD-10-CM | POA: Diagnosis not present

## 2019-02-08 DIAGNOSIS — Z30013 Encounter for initial prescription of injectable contraceptive: Secondary | ICD-10-CM | POA: Diagnosis not present

## 2019-02-08 DIAGNOSIS — Z3009 Encounter for other general counseling and advice on contraception: Secondary | ICD-10-CM | POA: Diagnosis not present

## 2019-04-26 DIAGNOSIS — Z3009 Encounter for other general counseling and advice on contraception: Secondary | ICD-10-CM | POA: Diagnosis not present

## 2019-04-26 DIAGNOSIS — Z30013 Encounter for initial prescription of injectable contraceptive: Secondary | ICD-10-CM | POA: Diagnosis not present

## 2019-08-26 ENCOUNTER — Other Ambulatory Visit: Payer: Self-pay

## 2019-08-26 ENCOUNTER — Encounter: Payer: Self-pay | Admitting: Physician Assistant

## 2019-08-26 ENCOUNTER — Ambulatory Visit (LOCAL_COMMUNITY_HEALTH_CENTER): Payer: Medicaid Other | Admitting: Physician Assistant

## 2019-08-26 VITALS — BP 110/77 | Ht 64.0 in | Wt 97.0 lb

## 2019-08-26 DIAGNOSIS — Z3009 Encounter for other general counseling and advice on contraception: Secondary | ICD-10-CM

## 2019-08-26 NOTE — Progress Notes (Addendum)
Here today to discuss irregular bleeding. Last Depo injection was 04/2019. Has had irregular bleeding since that time.Does not want to to restart Depo due to planning pregnancy in the next year. Requesting medication to assist with irregular bleeding. Declines [facility] Hal Morales, RN

## 2019-08-27 NOTE — Progress Notes (Signed)
   [place] problem visit  [facility]  Subjective:  Kathleen Romero is a 33 y.o. being seen today for  Irregular bleeding.   Chief Complaint  Patient presents with  . Vaginal Bleeding    Heavy vaginal bleeding    HPI  Patient states that she has had a lot of irregular bleeding since she d/c Depo.  States last Depo was given in June and that her 17 weeks was up on 08/23/2019.  Patient is getting married soon and would like to get pregnant ASAP.  Denies any symptoms or concerns.   Does the patient have a current or past history of drug use? No   No components found for: HCV]   Health Maintenance Due  Topic Date Due  . INFLUENZA VACCINE  06/19/2019    Review of Systems  All other systems reviewed and are negative.   The following portions of the patient's history were reviewed and updated as appropriate: allergies, current medications, past family history, past medical history, past social history, past surgical history and problem list. Problem list updated.   See flowsheet for other program required questions.  Objective:   Vitals:   08/26/19 1109  BP: 110/77  Weight: 97 lb (44 kg)  Height: 5\' 4"  (1.626 m)    Physical Exam Vitals signs reviewed.  Constitutional:      Appearance: Normal appearance.  HENT:     Head: Normocephalic and atraumatic.  Pulmonary:     Effort: Pulmonary effort is normal.  Neurological:     Mental Status: She is alert and oriented to person, place, and time.  Psychiatric:        Mood and Affect: Mood normal.        Behavior: Behavior normal.        Thought Content: Thought content normal.        Judgment: Judgment normal.       Assessment and Plan:  Kathleen Romero is a 33 y.o. female presenting to the [facility] Department for a Women's Health problem visit  1. Encounter for counseling regarding contraception Counseled patient that it is normal to have irregular bleeding  after Depo and that it could last for up to 18 months before regular bleeding pattern returns. Counseled patient that she could get pregnant at any time since Depo has stopped protecting her from getting pregnant but if she continues to have irregular bleeding it could take her longer to achieve pregnancy. Counseled patient re:  Hormonal options to help regulate bleeding for a few months such as OCs, patch and ring and patient declines those options. Counseled that she could try OTC IB 600-800 mg every 6-8 hr with food or milk to for up to 5 days in a row to reduce/stop bleeding. Enc to RTC if changes mind re:  Hormonal BCM option if bleeding continues or decides to delay pregnancy for a few months.  Counseled patient re:  Healthy habits for self and partner for healthy pregnancy and baby. Enc to start taking her PNV that she purchased 1 po QD.     No follow-ups on file.  No future appointments.  Jerene Dilling, PA

## 2019-09-14 ENCOUNTER — Other Ambulatory Visit: Payer: Self-pay

## 2019-09-14 ENCOUNTER — Encounter: Payer: Self-pay | Admitting: Emergency Medicine

## 2019-09-14 ENCOUNTER — Ambulatory Visit
Admission: EM | Admit: 2019-09-14 | Discharge: 2019-09-14 | Disposition: A | Discharge status: Home / Self Care | Payer: BC Managed Care – PPO | Attending: Family Medicine | Admitting: Family Medicine

## 2019-09-14 DIAGNOSIS — R319 Hematuria, unspecified: Secondary | ICD-10-CM

## 2019-09-14 DIAGNOSIS — N390 Urinary tract infection, site not specified: Secondary | ICD-10-CM

## 2019-09-14 DIAGNOSIS — Z3202 Encounter for pregnancy test, result negative: Secondary | ICD-10-CM

## 2019-09-14 LAB — URINALYSIS, COMPLETE (UACMP) WITH MICROSCOPIC
Bilirubin Urine: NEGATIVE
Glucose, UA: NEGATIVE mg/dL
Ketones, ur: NEGATIVE mg/dL
Nitrite: POSITIVE — AB
Protein, ur: 100 mg/dL — AB
RBC / HPF: >50 RBC/hpf (ref 0–5)
Specific Gravity, Urine: 1.025 (ref 1.005–1.030)
pH: 7 (ref 5.0–8.0)

## 2019-09-14 LAB — PREGNANCY, URINE: Preg Test, Ur: NEGATIVE

## 2019-09-14 MED ORDER — NITROFURANTOIN MONOHYD MACRO 100 MG PO CAPS
100.0000 mg | ORAL_CAPSULE | Freq: Two times a day (BID) | ORAL | 0 refills | Status: DC
Start: 2019-09-14 — End: 2019-10-24

## 2019-09-14 NOTE — ED Triage Notes (Signed)
Patient in today c/o urinary frequency, urgency and burning x 2 days. Patient denies fever or vaginal discharge.

## 2019-09-14 NOTE — Discharge Instructions (Signed)
It was very nice seeing you today in clinic. Thank you for entrusting me with your care.   As discussed, your urine is POSITIVE for infection. Will approach treatment as follows:  Prescription has been sent to your pharmacy for antibiotics.  Please pick up and take as directed. FINISH the entire course of medication even if you are feeling better.  A culture will be sent on your provided sample. If it comes back resistant to what I have prescribed you, someone will call you and let you know that we will need to change antibiotics. Increase fluid intake as much as possible to flush your urinary tract.  Water is always the best.  Avoid caffeine until your infection clears up, as it can contribute to painful bladder spasms.  May use Tylenol and/or Ibuprofen as needed for pain/fever. May use Azo products (over the counter) to help with pain/discomfort.   Make arrangements to follow up with your regular doctor in 1 week for re-evaluation. If your symptoms/condition worsens, please seek follow up care either here or in the ER. Please remember, our [facility] providers are "right here with you" when you need us.   Again, it was my pleasure to take care of you today. Thank you for choosing our clinic. I hope that you start to feel better quickly.   Abhiraj Dozal, MSN, APRN, FNP-C, CEN Advanced Practice Provider [facility] 

## 2019-09-14 NOTE — ED Provider Notes (Signed)
[place], [place]   Name: Kathleen Romero DOB: 1986-09-06 MRN: [id number] CSN: [id number] PCP: Patient, No Pcp Per  Arrival date and time:  09/14/19 1221  Chief Complaint:  Urinary Frequency   NOTE: Prior to seeing the patient today, I have reviewed the triage nursing documentation and vital signs. Clinical staff has updated patient's PMH/PSHx, current medication list, and drug allergies/intolerances to ensure comprehensive history available to assist in medical decision making.   History:   HPI: Kathleen Romero is a 33 y.o. female who presents today with complaints of urinary symptoms that began with acute onset 2 days ago. She complains of dysuria, frequency, and urgency. She has been voiding small amounts. She has not appreciated any gross hematuria, nor has she noticed her urine being malodorous. Patient denies any associated nausea, vomiting, fever, and chills. She has not experienced any pain in her lower back or flank areas. She has significant suprapubic pressure. Patient advises that she does not have a past medical history that is significant for recurrent urinary tract infections. She denies any vaginal pain, bleeding, or discharge. Patient is unsure of her LMP; "I am having irregular periods". There are no concerns that she is currently pregnant.   Past Medical History:  Diagnosis Date  . Infection    Yeast;was treated 08/15/12 w/ Monistat 7  . Infection    BV x 2  . No pertinent past medical history     Past Surgical History:  Procedure Laterality Date  . NO PAST SURGERIES      Family History  Problem Relation Age of Onset  . Hypertension Mother   . Hypertension Father        deceased  . Alcohol abuse Father   . Other Other        Huntington's Chorea;MGF, M. Uncle(deceased);M. Aunt (showing signs of)  . Asthma Paternal Uncle        x 2    Social History   Tobacco Use  . Smoking status: Current Every Day Smoker    Packs/day: 0.25    Years: 12.00    Pack  years: 3.00    Types: Cigarettes, Cigars    Last attempt to quit: 07/20/2012    Years since quitting: 7.1  . Smokeless tobacco: Never Used  Substance Use Topics  . Alcohol use: Yes    Comment: Rarely  . Drug use: No    Patient Active Problem List   Diagnosis Date Noted  . Postpartum hemorrhage 03/08/2013  . Thrombocytopenia, unspecified ([place]) 02/08/2013  . 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 FHx: Huntington's chorea 09/09/2012    Home Medications:    No outpatient medications have been marked as taking for the 09/14/19 encounter [facility] Encounter).    Allergies:   Patient has no known allergies.  Review of Systems (ROS): Review of Systems  Constitutional: Negative for chills and fever.  Respiratory: Negative for cough and shortness of breath.   Cardiovascular: Negative for chest pain and palpitations.  Gastrointestinal: Positive for abdominal pain. Negative for nausea and vomiting.  Genitourinary: Positive for decreased urine volume, dysuria, frequency and urgency. Negative for flank pain, hematuria, pelvic pain, vaginal bleeding, vaginal discharge and vaginal pain.  Musculoskeletal: Negative for back pain.  Skin: Negative for color change, pallor and rash.  Neurological: Negative for dizziness, syncope, weakness and headaches.  All other systems reviewed and are negative.    Vital Signs: Today's Vitals   09/14/19 1239 09/14/19 1240 09/14/19 1321  BP:  (!) 129/95  Pulse:  96   Resp:  16   Temp:  98.9 F (37.2 C)   TempSrc:  Oral   SpO2:  99%   Weight:  100 lb (45.4 kg)   Height:  5\' 2"  (1.575 m)   PainSc: 5   5     Physical Exam: Physical Exam  Constitutional: She is oriented to person, place, and time and well-developed, well-nourished, and in no distress.  HENT:  Head: Normocephalic and atraumatic.  Mouth/Throat: Mucous membranes are normal.  Eyes: Pupils are equal, round, and reactive to light.  Neck: Neck supple.   Cardiovascular: Normal rate, regular rhythm, normal heart sounds and intact distal pulses. Exam reveals no gallop and no friction rub.  No murmur heard. Pulmonary/Chest: Effort normal and breath sounds normal. No respiratory distress. She has no wheezes. She has no rales.  Abdominal: Soft. Normal appearance. There is abdominal tenderness in the suprapubic area. There is no CVA tenderness.  Neurological: She is alert and oriented to person, place, and time. Gait normal.  Skin: Skin is warm and dry. No rash noted.  Psychiatric: Mood, memory, affect and judgment normal.  Nursing note and vitals reviewed.   Urgent Care Treatments / Results:   LABS: PLEASE NOTE: all labs that were ordered this encounter are listed, however only abnormal results are displayed. Labs Reviewed  URINALYSIS, COMPLETE (UACMP) WITH MICROSCOPIC - Abnormal; Notable for the following components:      Result Value   APPearance CLOUDY (*)    Hgb urine dipstick LARGE (*)    Protein, ur 100 (*)    Nitrite POSITIVE (*)    Leukocytes,Ua TRACE (*)    Bacteria, UA MANY (*)    All other components within normal limits  URINE CULTURE  PREGNANCY, URINE    EKG: -None  RADIOLOGY: No results found.  PROCEDURES: Procedures  MEDICATIONS RECEIVED THIS VISIT: Medications - No data to display  PERTINENT CLINICAL COURSE NOTES/UPDATES:   Initial Impression / Assessment and Plan / Urgent Care Course:  Pertinent labs & imaging results that were available during my care of the patient were personally reviewed by me and considered in my medical decision making (see lab/imaging section of note for values and interpretations).  Kathleen Romero is a 33 y.o. female who [facility] with complaints of Urinary Frequency   Patient is well appearing overall in clinic today. She does not appear to be in any acute distress. Presenting symptoms (see HPI) and exam as documented above. Urine HCG (-) for  pregnancy. UA was (+) for infection; reflex culture sent. Will treat with a 5 day course of nitrofurantoin. Patient encouraged to complete the entire course of antibiotics even if she begins to feel better. She was advised that if culture demonstrates resistance to the prescribed antibiotic, she will be contacted and advised of the need to change the antibiotic being used to treat her infection. Patient encouraged to increase her fluid intake as much as possible. Discussed that water is always best to flush the urinary tract. She was advised to avoid caffeine containing fluids until her infections clears, as caffeine can cause her to experience painful bladder spasms. May use Tylenol and/or Ibuprofen as needed for pain/fever. May also use over the counter phenazopyridine to help relieve her current urinary pain.   Discussed follow up with primary care physician in 1 week for re-evaluation. I have reviewed the follow up and strict return precautions for any new or worsening symptoms. Patient is aware  of symptoms that would be deemed urgent/emergent, and would thus require further evaluation either here or in the emergency department. At the time of discharge, she verbalized understanding and consent with the discharge plan as it was reviewed with her. All questions were fielded by provider and/or clinic staff prior to patient discharge.    Final Clinical Impressions / Urgent Care Diagnoses:   Final diagnoses:  Urinary tract infection with hematuria, site unspecified    New Prescriptions:  [place] Controlled Substance Registry consulted? Not Applicable  Meds ordered this encounter  Medications  . nitrofurantoin, macrocrystal-monohydrate, (MACROBID) 100 MG capsule    Sig: Take 1 capsule (100 mg total) by mouth 2 (two) times daily.    Dispense:  10 capsule    Refill:  0    Recommended Follow up Care:  Patient encouraged to follow up with the following provider within the specified time frame, or sooner  as dictated by the severity of her symptoms. As always, she was instructed that for any urgent/emergent care needs, she should seek care either here or in the emergency department for more immediate evaluation.  Follow-up Information    PCP In 1 week.   Why: General reassessment of symptoms if not improving        NOTE: This note was prepared using Scientist, clinical (histocompatibility and immunogenetics) along with smaller Lobbyist. Despite my best ability to proofread, there is the potential that transcriptional errors may still occur from this process, and are completely unintentional.    Verlee Monte, NP 09/14/19 1447

## 2019-09-16 ENCOUNTER — Telehealth (HOSPITAL_COMMUNITY): Payer: Self-pay | Admitting: Emergency Medicine

## 2019-09-16 LAB — URINE CULTURE: Culture: >=100000 — AB

## 2019-09-16 NOTE — Telephone Encounter (Signed)
Urine culture was positive for e coli and was given  macrobid at urgent care visit. Pt contacted and made aware, educated on completing antibiotic and to follow up if symptoms are persistent. Verbalized understanding.    

## 2019-10-24 ENCOUNTER — Other Ambulatory Visit: Payer: Self-pay

## 2019-10-24 ENCOUNTER — Encounter: Payer: Self-pay | Admitting: Emergency Medicine

## 2019-10-24 ENCOUNTER — Ambulatory Visit
Admission: EM | Admit: 2019-10-24 | Discharge: 2019-10-24 | Disposition: A | Discharge status: Home / Self Care | Payer: BC Managed Care – PPO | Attending: Urgent Care | Admitting: Urgent Care

## 2019-10-24 DIAGNOSIS — Z3202 Encounter for pregnancy test, result negative: Secondary | ICD-10-CM

## 2019-10-24 DIAGNOSIS — R350 Frequency of micturition: Secondary | ICD-10-CM | POA: Diagnosis not present

## 2019-10-24 DIAGNOSIS — B9689 Other specified bacterial agents as the cause of diseases classified elsewhere: Secondary | ICD-10-CM | POA: Diagnosis not present

## 2019-10-24 DIAGNOSIS — N760 Acute vaginitis: Secondary | ICD-10-CM

## 2019-10-24 LAB — URINALYSIS, COMPLETE (UACMP) WITH MICROSCOPIC
Glucose, UA: NEGATIVE mg/dL
Leukocytes,Ua: NEGATIVE
Nitrite: POSITIVE — AB
Protein, ur: 100 mg/dL — AB
RBC / HPF: >50 RBC/hpf (ref 0–5)
Specific Gravity, Urine: 1.025 (ref 1.005–1.030)
pH: 5.5 (ref 5.0–8.0)

## 2019-10-24 LAB — WET PREP, GENITAL
Sperm: NONE SEEN
Trich, Wet Prep: NONE SEEN
WBC, Wet Prep HPF POC: NONE SEEN
Yeast Wet Prep HPF POC: NONE SEEN

## 2019-10-24 LAB — PREGNANCY, URINE: Preg Test, Ur: NEGATIVE

## 2019-10-24 MED ORDER — METRONIDAZOLE 500 MG PO TABS: 500.0000 mg | ORAL_TABLET | Freq: Two times a day (BID) | ORAL | 0 refills | Status: DC

## 2019-10-24 NOTE — ED Triage Notes (Signed)
Patient in today c/o urinary urgency, nausea and vaginal bleeding off & on x 2 weeks. Patient stopped Depo in June 2020 and has had very irregular periods. Patient is trying to get pregnant.

## 2019-10-24 NOTE — ED Provider Notes (Signed)
[facility]    CSN: [id number] Arrival date & time: 10/24/19  1237      History   Chief Complaint Chief Complaint  Patient presents with  . Nausea    APPT  . Urinary Urgency  . Vaginal Bleeding    HPI Kathleen Romero is a 33 y.o. female.   HPI  33 year old female presents today with urinary urgency occasional nausea and vaginal bleeding that she has had off and on for 2 weeks.  She is trying to become pregnant with her new husband.  She discontinued her Depo in June 2020 and has had very irregular periods since then.  Currently on her period today.  Has no fever or chills.  She does not have any dysuria or frequency.  She does experience some urinary and satiety.        Past Medical History:  Diagnosis Date  . Infection    Yeast;was treated 08/15/12 w/ Monistat 7  . Infection    BV x 2  . No pertinent past medical history     Patient Active Problem List   Diagnosis Date Noted  . Postpartum hemorrhage 03/08/2013  . Thrombocytopenia, unspecified ([facility]) 02/08/2013  . 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 FHx: Huntington's chorea 09/09/2012    Past Surgical History:  Procedure Laterality Date  . NO PAST SURGERIES      OB History    Gravida  1   Para  1   Term  1   Preterm      AB      Living  1     SAB      TAB      Ectopic      Multiple      Live Births  1            Home Medications    Prior to Admission medications   Medication Sig Start Date End Date Taking? Authorizing Provider  metroNIDAZOLE (FLAGYL) 500 MG tablet Take 1 tablet (500 mg total) by mouth 2 (two) times daily. 10/24/19   Lutricia Feil, PA-C    Family History Family History  Problem Relation Age of Onset  . Hypertension Mother   . Hypertension Father        deceased  . Alcohol abuse Father   . Other Other        Huntington's Chorea;MGF, M. Uncle(deceased);M. Aunt (showing signs of)  . Asthma Paternal Uncle    x 2    Social History Social History   Tobacco Use  . Smoking status: Current Every Day Smoker    Packs/day: 0.25    Years: 12.00    Pack years: 3.00    Types: Cigarettes, Cigars  . Smokeless tobacco: Never Used  Substance Use Topics  . Alcohol use: Yes    Comment: Rarely  . Drug use: No     Allergies   Patient has no known allergies.   Review of Systems Review of Systems  Constitutional: Negative for activity change, appetite change, chills, fatigue and fever.  Genitourinary: Positive for menstrual problem, urgency and vaginal bleeding. Negative for dysuria, flank pain, frequency, vaginal discharge and vaginal pain.  All other systems reviewed and are negative.    Physical Exam Triage Vital Signs ED Triage Vitals [10/24/19 1255]  Enc Vitals Group     BP 113/71     Pulse Rate 94     Resp 18     Temp 98.2 F (36.8 C)  Temp Source Oral     SpO2 100 %     Weight 100 lb (45.4 kg)     Height 5\' 2"  (1.575 m)     Head Circumference      Peak Flow      Pain Score 0     Pain Loc      Pain Edu?      Excl. in GC?    No data found.  Updated Vital Signs BP 113/71 (BP Location: Left Arm)   Pulse 94   Temp 98.2 F (36.8 C) (Oral)   Resp 18   Ht 5\' 2"  (1.575 m)   Wt 100 lb (45.4 kg)   LMP 10/20/2019 (Approximate)   SpO2 100%   BMI 18.29 kg/m   Visual Acuity Right Eye Distance:   Left Eye Distance:   Bilateral Distance:    Right Eye Near:   Left Eye Near:    Bilateral Near:     Physical Exam Vitals signs and nursing note reviewed.  Constitutional:      General: She is not in acute distress.    Appearance: Normal appearance. She is normal weight. She is not ill-appearing or toxic-appearing.  HENT:     Head: Normocephalic and atraumatic.  Eyes:     Conjunctiva/sclera: Conjunctivae normal.  Neck:     Musculoskeletal: Normal range of motion and neck supple.  Cardiovascular:     Rate and Rhythm: Normal rate and regular rhythm.     Heart sounds:  Normal heart sounds.  Pulmonary:     Effort: Pulmonary effort is normal.     Breath sounds: Normal breath sounds.  Abdominal:     General: Abdomen is flat. Bowel sounds are normal. There is no distension.     Palpations: There is no mass.     Tenderness: There is no abdominal tenderness. There is no right CVA tenderness, left CVA tenderness, guarding or rebound.     Hernia: No hernia is present.  Genitourinary:    Comments: Patient performed self swabbing Musculoskeletal: Normal range of motion.  Skin:    General: Skin is warm and dry.  Neurological:     General: No focal deficit present.     Mental Status: She is alert and oriented to person, place, and time.  Psychiatric:        Mood and Affect: Mood normal.        Behavior: Behavior normal.        Thought Content: Thought content normal.        Judgment: Judgment normal.      UC Treatments / Results  Labs (all labs ordered are listed, but only abnormal results are displayed) Labs Reviewed  WET PREP, GENITAL - Abnormal; Notable for the following components:      Result Value   Clue Cells Wet Prep HPF POC PRESENT (*)    All other components within normal limits  URINALYSIS, COMPLETE (UACMP) WITH MICROSCOPIC - Abnormal; Notable for the following components:   Color, Urine RED (*)    APPearance TURBID (*)    Hgb urine dipstick LARGE (*)    Bilirubin Urine SMALL (*)    Ketones, ur TRACE (*)    Protein, ur 100 (*)    Nitrite POSITIVE (*)    Bacteria, UA FEW (*)    All other components within normal limits  URINE CULTURE  PREGNANCY, URINE    EKG   Radiology No results found.  Procedures Procedures (including critical care time)  Medications  Ordered in UC Medications - No data to display  Initial Impression / Assessment and Plan / UC Course  I have reviewed the triage vital signs and the nursing notes.  Pertinent labs & imaging results that were available during my care of the patient were reviewed by me and  considered in my medical decision making (see chart for details).   I reviewed the laboratory results with the patient.  Is greater than 50 WBCs likely from her menstruation.  Had no white cells.  She did have positive nitrites.  Her wet prep was positive for clue cells.  Nausea does not seem to be a prominent feature.  Pregnancy test was negative today.  Place her on Flagyl twice daily for 7 days.  She should follow-up with her GYN for her menstrual irregularities after coming off Depo.  I did culture her urine for completeness.   Final Clinical Impressions(s) / UC Diagnoses   Final diagnoses:  BV (bacterial vaginosis)   Discharge Instructions   None    ED Prescriptions    Medication Sig Dispense Auth. Provider   metroNIDAZOLE (FLAGYL) 500 MG tablet Take 1 tablet (500 mg total) by mouth 2 (two) times daily. 14 tablet Lorin Picket, PA-C     PDMP not reviewed this encounter.   Lorin Picket, PA-C 10/24/19 1407

## 2019-10-26 LAB — URINE CULTURE: Culture: >=100000 — AB

## 2019-10-27 ENCOUNTER — Encounter (HOSPITAL_COMMUNITY): Payer: Self-pay

## 2019-10-27 ENCOUNTER — Telehealth (HOSPITAL_COMMUNITY): Payer: Self-pay | Admitting: Emergency Medicine

## 2019-10-27 MED ORDER — CEPHALEXIN 500 MG PO CAPS: 500.0000 mg | ORAL_CAPSULE | Freq: Two times a day (BID) | ORAL | 0 refills | Status: AC

## 2019-10-27 NOTE — Telephone Encounter (Signed)
Per Claiborne Billings, pt needs treatment with keflex BID x5 days. Attempted to reach patient. No answer at this time. Voicemail left.

## 2019-10-27 NOTE — Telephone Encounter (Signed)
Patient contacted by phone and made aware of urine culure   Results and medicine at pharmacy. Pt verbalized understanding and had all questions answered.

## 2019-11-15 ENCOUNTER — Ambulatory Visit
Admission: EM | Admit: 2019-11-15 | Discharge: 2019-11-15 | Disposition: A | Discharge status: Home / Self Care | Payer: BC Managed Care – PPO | Attending: Internal Medicine | Admitting: Internal Medicine

## 2019-11-15 ENCOUNTER — Other Ambulatory Visit: Payer: Self-pay

## 2019-11-15 DIAGNOSIS — N3001 Acute cystitis with hematuria: Secondary | ICD-10-CM | POA: Diagnosis present

## 2019-11-15 LAB — URINALYSIS, COMPLETE (UACMP) WITH MICROSCOPIC
Bilirubin Urine: NEGATIVE
Glucose, UA: NEGATIVE mg/dL
Ketones, ur: NEGATIVE mg/dL
Leukocytes,Ua: NEGATIVE
Nitrite: NEGATIVE
Protein, ur: 30 mg/dL — AB
Specific Gravity, Urine: 1.02 (ref 1.005–1.030)
pH: 8.5 — ABNORMAL HIGH (ref 5.0–8.0)

## 2019-11-15 LAB — PREGNANCY, URINE: Preg Test, Ur: NEGATIVE

## 2019-11-15 MED ORDER — CEPHALEXIN 500 MG PO CAPS: 500.0000 mg | ORAL_CAPSULE | Freq: Two times a day (BID) | ORAL | 0 refills | Status: AC

## 2019-11-15 NOTE — ED Provider Notes (Signed)
[facility]    CSN: [id number] Arrival date & time: 11/15/19  1620      History   Chief Complaint Chief Complaint  Patient presents with  . Urinary Urgency    HPI Kathleen Romero is a 33 y.o. female with no past medical history comes to the urgent care with a 2-day history of dysuria, urgency and frequency.  Patient denies any flank pain, nausea vomiting or generalized body aches.  No fever or chills.  Patient recently got married in November and this will be the second episode of urinary infection that she has had.  She admits to having sexual intercourse on a daily basis.  Does not void after sexual intercourse.  She also gives a history of some spotting and some blood in urine.  Patient denies dyspareunia.   HPI  Past Medical History:  Diagnosis Date  . Infection    Yeast;was treated 08/15/12 w/ Monistat 7  . Infection    BV x 2  . No pertinent past medical history     Patient Active Problem List   Diagnosis Date Noted  . Postpartum hemorrhage 03/08/2013  . Thrombocytopenia, unspecified (HCC) 02/08/2013  . 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 FHx: Huntington's chorea 09/09/2012    Past Surgical History:  Procedure Laterality Date  . NO PAST SURGERIES      OB History    Gravida  1   Para  1   Term  1   Preterm      AB      Living  1     SAB      TAB      Ectopic      Multiple      Live Births  1            Home Medications    Prior to Admission medications   Medication Sig Start Date End Date Taking? Authorizing Provider  cephALEXin (KEFLEX) 500 MG capsule Take 1 capsule (500 mg total) by mouth 2 (two) times daily for 7 days. 11/15/19 11/22/19  Merrilee Jansky, MD    Family History Family History  Problem Relation Age of Onset  . Hypertension Mother   . Hypertension Father        deceased  . Alcohol abuse Father   . Other Other        Huntington's Chorea;MGF, M. Uncle(deceased);M. Aunt  (showing signs of)  . Asthma Paternal Uncle        x 2    Social History Social History   Tobacco Use  . Smoking status: Current Every Day Smoker    Packs/day: 0.25    Years: 12.00    Pack years: 3.00    Types: Cigarettes, Cigars  . Smokeless tobacco: Never Used  Substance Use Topics  . Alcohol use: Yes    Comment: Rarely  . Drug use: No     Allergies   Patient has no known allergies.   Review of Systems Review of Systems  Constitutional: Negative for activity change, chills, fatigue and fever.  HENT: Negative.   Respiratory: Negative for cough, chest tightness and shortness of breath.   Gastrointestinal: Negative for diarrhea, nausea and vomiting.  Genitourinary: Positive for dysuria, frequency, hematuria and urgency.  Musculoskeletal: Negative for arthralgias and myalgias.  Skin: Negative.   Neurological: Negative for dizziness, weakness and headaches.     Physical Exam Triage Vital Signs ED Triage Vitals  Enc Vitals Group  BP 11/15/19 1634 116/81     Pulse Rate 11/15/19 1634 73     Resp 11/15/19 1634 16     Temp 11/15/19 1634 98.1 F (36.7 C)     Temp Source 11/15/19 1634 Oral     SpO2 11/15/19 1634 100 %     Weight 11/15/19 1632 100 lb (45.4 kg)     Height 11/15/19 1632 5\' 2"  (1.575 m)     Head Circumference --      Peak Flow --      Pain Score 11/15/19 1632 2     Pain Loc --      Pain Edu? --      Excl. in GC? --    No data found.  Updated Vital Signs BP 116/81 (BP Location: Left Arm)   Pulse 73   Temp 98.1 F (36.7 C) (Oral)   Resp 16   Ht 5\' 2"  (1.575 m)   Wt 45.4 kg   LMP 10/20/2019 (Approximate)   SpO2 100%   BMI 18.29 kg/m   Visual Acuity Right Eye Distance:   Left Eye Distance:   Bilateral Distance:    Right Eye Near:   Left Eye Near:    Bilateral Near:     Physical Exam Constitutional:      Appearance: Normal appearance.  Cardiovascular:     Rate and Rhythm: Normal rate and regular rhythm.     Pulses: Normal  pulses.     Heart sounds: Normal heart sounds.  Pulmonary:     Effort: Pulmonary effort is normal. No respiratory distress.     Breath sounds: Normal breath sounds. No stridor. No wheezing.  Abdominal:     General: Bowel sounds are normal.     Palpations: Abdomen is soft.     Tenderness: There is no abdominal tenderness. There is no right CVA tenderness, left CVA tenderness, guarding or rebound.  Musculoskeletal:        General: No tenderness, deformity or signs of injury. Normal range of motion.  Skin:    General: Skin is warm.     Capillary Refill: Capillary refill takes less than 2 seconds.     Findings: No bruising or erythema.  Neurological:     Mental Status: She is alert and oriented to person, place, and time. Mental status is at baseline.     Motor: No weakness.     Coordination: Coordination normal.      UC Treatments / Results  Labs (all labs ordered are listed, but only abnormal results are displayed) Labs Reviewed  URINALYSIS, COMPLETE (UACMP) WITH MICROSCOPIC - Abnormal; Notable for the following components:      Result Value   APPearance CLOUDY (*)    pH 8.5 (*)    Hgb urine dipstick LARGE (*)    Protein, ur 30 (*)    Bacteria, UA MANY (*)    All other components within normal limits  URINE CULTURE  PREGNANCY, URINE    EKG   Radiology No results found.  Procedures Procedures (including critical care time)  Medications Ordered in UC Medications - No data to display  Initial Impression / Assessment and Plan / UC Course  I have reviewed the triage vital signs and the nursing notes.  Pertinent labs & imaging results that were available during my care of the patient were reviewed by me and considered in my medical decision making (see chart for details).    1.  Acute cystitis with hematuria: Urinalysis is markable for large  hemoglobin, protein, many bacteria. Urine cultures have been sent Urine pregnancy test is negative Keflex 500 mg twice daily  for 7 days If patient symptoms worsen she is advised to return to urgent care to be reevaluated. Patient is also advised to void after sexual intercourse to reduce the incidence of urinary tract infection. Final Clinical Impressions(s) / UC Diagnoses   Final diagnoses:  Acute cystitis with hematuria   Discharge Instructions   None    ED Prescriptions    Medication Sig Dispense Auth. Provider   cephALEXin (KEFLEX) 500 MG capsule Take 1 capsule (500 mg total) by mouth 2 (two) times daily for 7 days. 14 capsule Partick Musselman, Myrene Galas, MD     PDMP not reviewed this encounter.   Chase Picket, MD 11/15/19 2005

## 2019-11-15 NOTE — ED Triage Notes (Signed)
Patient states that she has been having urinary urgency x 2 days. Patient reports that she thinks she may have another infection.

## 2019-11-16 LAB — URINE CULTURE

## 2020-01-21 ENCOUNTER — Other Ambulatory Visit: Payer: Self-pay

## 2020-01-21 ENCOUNTER — Encounter: Payer: Self-pay | Admitting: Family Medicine

## 2020-01-21 ENCOUNTER — Ambulatory Visit: Payer: Medicaid Other | Admitting: Family Medicine

## 2020-01-21 VITALS — BP 104/64 | Ht 64.0 in | Wt 93.0 lb

## 2020-01-21 DIAGNOSIS — N939 Abnormal uterine and vaginal bleeding, unspecified: Secondary | ICD-10-CM

## 2020-01-21 DIAGNOSIS — Z113 Encounter for screening for infections with a predominantly sexual mode of transmission: Secondary | ICD-10-CM

## 2020-01-21 DIAGNOSIS — Z3169 Encounter for other general counseling and advice on procreation: Secondary | ICD-10-CM

## 2020-01-21 DIAGNOSIS — Z3009 Encounter for other general counseling and advice on contraception: Secondary | ICD-10-CM

## 2020-01-21 LAB — PREGNANCY, URINE: Preg Test, Ur: NEGATIVE

## 2020-01-21 LAB — WET PREP FOR TRICH, YEAST, CLUE
Trichomonas Exam: NEGATIVE
Yeast Exam: NEGATIVE

## 2020-01-21 LAB — HEMOGLOBIN, FINGERSTICK: Hemoglobin: 14.8 g/dL (ref 11.1–15.9)

## 2020-01-21 MED ORDER — NORGESTREL-ETHINYL ESTRADIOL 0.3-30 MG-MCG PO TABS: 1.0000 | ORAL_TABLET | Freq: Every day | ORAL | 0 refills | Status: DC

## 2020-01-21 NOTE — Progress Notes (Signed)
Here today for irregular bleeding. Wants to discuss taking OTC's to "help stop this bleeding so I can get pregnant." Declines MVI's "I can't swallow pills." Also declines Healthy Pregnancy Booklet. Last PE and Pap Smear here was 10/19/2018. LSIL results with a 12/11/2018 Colpo follow up with [facility]. Tawny Hopping, RN

## 2020-01-21 NOTE — Progress Notes (Signed)
Family Planning Visit  Subjective:  Kathleen Romero is a 34 y.o. being seen today for  Chief Complaint  Patient presents with  . Menstrual Problem    Irregular Periods    Pt has FHx: Huntington's chorea; LGSIL (low grade squamous intraepithelial dysplasia); and Thrombocytopenia, unspecified (HCC) on their problem list.  HPI   She was on depo x6 yrs, last injection June 2020, has been trying to get pregnant. States she has had irregular periods since stopping Depo, bled/spotted for most of February and currently goes through 2 pantyliners per day. States she has had issues with irregular bleeding in the past, was given a "white pill" which made periods regular and she requests this today.  Pt does not meet any of the following contraindications to estrogen use: -Age ?35 years and smoking ?15 cigarettes per day  -Migraine with aura -Two or more RF for arterial CVD (such as older age, smoking, diabetes, and hypertension) -HTN -Breast cancer -VTE hx or acute event -Known thrombogenic mutations -Known ischemic heart disease -History of stroke -Complicated valvular heart disease (pulmonary HTN, risk for afib, hx subacute bacterial endocarditis) -Cirrhosis, Hepatocellular adenoma or malignant hepatoma    Patient's last menstrual period was 12/27/2019 (exact date).  Last pap: Pap 10/19/2018 = LSIL, HPV +, Colpo 12/11/18 w/ low-grade changes, advised pap repeated in 1 yr.   Patient reports 1 partner(s) in last year. Do they desire STI screening (if no, why not)? Yes  Does the patient desire a pregnancy in the next year? yes   34 y.o., Body mass index is 15.96 kg/m. - Is patient eligible for HA1C diabetes screening based on BMI and age >28?  no  Does the patient have a current or past history of drug use? yes No components found for: HCV  See flowsheet for other program required questions.   Health Maintenance Due  Topic Date Due  . INFLUENZA VACCINE  06/19/2019     ROS  The following portions of the patient's history were reviewed and updated as appropriate: allergies, current medications, past family history, past medical history, past social history, past surgical history and problem list. Problem list updated.  Objective:  BP 104/64   Ht 5\' 4"  (1.626 m)   Wt 93 lb (42.2 kg)   LMP 12/27/2019 (Exact Date)   BMI 15.96 kg/m    Physical Exam Vitals and nursing note reviewed.  Constitutional:      Appearance: Normal appearance.  HENT:     Head: Normocephalic and atraumatic.     Mouth/Throat:     Mouth: Mucous membranes are moist.     Pharynx: Oropharynx is clear. No oropharyngeal exudate or posterior oropharyngeal erythema.  Pulmonary:     Effort: Pulmonary effort is normal.  Abdominal:     General: Abdomen is flat.     Palpations: There is no mass.     Tenderness: There is no abdominal tenderness. There is no rebound.  Genitourinary:    General: Normal vulva.     Exam position: Lithotomy position.     Pubic Area: No rash or pubic lice.      Labia:        Right: No rash or lesion.        Left: No rash or lesion.      Vagina: Bleeding (moderate amt) present. No vaginal discharge, erythema or lesions.     Cervix: No cervical motion tenderness, discharge, friability, lesion or erythema.     Uterus: Normal.  Adnexa: Right adnexa normal and left adnexa normal.     Rectum: Normal.  Lymphadenopathy:     Head:     Right side of head: No preauricular or posterior auricular adenopathy.     Left side of head: No preauricular or posterior auricular adenopathy.     Cervical: No cervical adenopathy.     Upper Body:     Right upper body: No supraclavicular or axillary adenopathy.     Left upper body: No supraclavicular or axillary adenopathy.     Lower Body: No right inguinal adenopathy. No left inguinal adenopathy.  Skin:    General: Skin is warm and dry.     Findings: No rash.  Neurological:     Mental Status: She is alert and  oriented to person, place, and time.        Assessment and Plan:  Kathleen Romero is a 34 y.o. female presenting to the [facility] for a well woman exam/family planning visit  Contraception counseling: Patient desires nothing - hoping to get pregnant. She will follow up in  1 year for surveillance.  She was told to call with any further questions, or with any concerns about this method of contraception.   Emergency Contraception: n/a - pt desires pregnancy   1. Vaginal bleeding -As she has had irregular bleeding in the past after stopping depo before this is likely same. Checking STIs and urine preg today. Will prescribe 2 months of OCP to help regulate periods (no longer than this as she is hoping to get pregnant), advised if still has irregular bleeding after completing OCPs she needs to see gynecology for further investingation.  -Hgb today 14 - Pregnancy, urine - Hemoglobin, venipuncture - norgestrel-ethinyl estradiol (LO/OVRAL) 0.3-30 MG-MCG tablet; Take 1 tablet by mouth daily.  Dispense: 2 Package; Refill: 0  2. Encounter for preconception consultation -Preconception counseling today: -Advised to begin taking PNV w/folic acid.  -Encouraged regular exercise and healthy diet w/plenty of fruits and vegetables.  -We reviewed their current problems and medications in terms of pregnancy safety.  -We discussed general early pregnancy precautions.   -Reviewed services at the office regarding prenatal care.   -Advised cessation from alcohol, tobacco and marijuana.  3. Family planning services -Pap today in follow up to colpo for 12/11/18 - IGP, Aptima HPV  4. Screening examination for venereal disease -Screenings today as below. Treat wet prep per standing order. -Patient does meet criteria for HepB, HepC Screening. Accepts these screenings. -Counseled on warning s/sx and when to seek care. Recommended condom use with all sex and discussed importance of  condom use for STI prevention. - HBV Antigen/Antibody [facility] - HIV/HCV [place] [facility] - Syphilis Serology, [facility] - WET PREP FOR [place], YEAST, [place] [facility]     Return if symptoms worsen or fail to improve.  No future appointments.  Kandee Keen, PA-C

## 2020-01-21 NOTE — Progress Notes (Signed)
Wet mount results reviewed. Per standing orders no treatment indicated. Tawny Hopping, RN

## 2020-01-26 LAB — IGP, APTIMA HPV
HPV Aptima: POSITIVE — AB
PAP Smear Comment: 0

## 2020-01-26 LAB — HEPATITIS B SURFACE ANTIGEN

## 2020-01-27 LAB — HM HIV SCREENING LAB: HM HIV Screening: NEGATIVE

## 2020-01-27 LAB — HM HEPATITIS C SCREENING LAB: HM Hepatitis Screen: NEGATIVE

## 2020-01-28 ENCOUNTER — Telehealth: Payer: Self-pay

## 2020-01-28 NOTE — Telephone Encounter (Signed)
Mychart message sent to patient re: abn pap and need for colpo. Richmond Campbell, RN

## 2020-01-28 NOTE — Telephone Encounter (Signed)
TC from patient.  Discussed abn pap and need for colpo.  Patient would like to go to [facility]. Referral completed.Richmond Campbell, RN

## 2020-04-28 ENCOUNTER — Telehealth: Payer: Self-pay

## 2020-04-28 NOTE — Telephone Encounter (Signed)
TC back to patient re: colpo referral.  Patient was referred to [facility] not [facility]. Correct # provided. RN will send message to [facility]. Richmond Campbell, RN

## 2020-04-28 NOTE — Telephone Encounter (Signed)
TC with patient. Informed her that [facility] has attempted to reach her several times re: scheduling a colpo.  Patient reports has never received call.  RN provided [facility] ph # and instructed patient to call [facility] to schedule appt.  RN will send [facility] message re: patient to call for appt. Richmond Campbell, RN

## 2020-05-15 ENCOUNTER — Telehealth: Payer: Self-pay

## 2020-05-15 NOTE — Telephone Encounter (Signed)
Attempted TC to patient re: colpo. [facility] has made numerous attempts to reach patient to schedule colpo.  RN has provided contact info for [facility] so that patient could make appt herself.   Left message for patient to call [facility] re: colpo. Will close to f/u and create FYI. Richmond Campbell, RN

## 2020-09-05 ENCOUNTER — Other Ambulatory Visit: Payer: Self-pay

## 2020-09-05 ENCOUNTER — Ambulatory Visit (LOCAL_COMMUNITY_HEALTH_CENTER): Payer: Self-pay

## 2020-09-05 VITALS — BP 116/79 | Ht 62.0 in | Wt 91.5 lb

## 2020-09-05 DIAGNOSIS — Z3201 Encounter for pregnancy test, result positive: Secondary | ICD-10-CM

## 2020-09-05 LAB — PREGNANCY, URINE: Preg Test, Ur: POSITIVE — AB

## 2020-09-05 MED ORDER — PRENATAL 27-0.8 MG PO TABS
1.0000 | ORAL_TABLET | Freq: Every day | ORAL | 0 refills | Status: AC
Start: 2020-09-05 — End: 2020-12-14

## 2020-09-05 NOTE — Progress Notes (Signed)
UPT positive. Plans prenatal care at [facility]. Plans to visit DSS for medicaid for Preg. Women today. Jerel Shepherd, RN

## 2020-09-18 NOTE — Progress Notes (Signed)
Follow-up on +PT. Patient has new OB appointment scheduled at [facility] for 10/03/20.Burt Knack, RN

## 2020-10-18 DIAGNOSIS — Z419 Encounter for procedure for purposes other than remedying health state, unspecified: Secondary | ICD-10-CM | POA: Diagnosis not present

## 2020-10-23 NOTE — Patient Instructions (Signed)
WHAT OB PATIENTS CAN EXPECT   Confirmation of pregnancy and ultrasound ordered if medically indicated-[redacted] weeks gestation  New OB (NOB) intake with nurse and New OB (NOB) labs- [redacted] weeks gestation  New OB (NOB) physical examination with provider- 11/[redacted] weeks gestation  Flu vaccine-[redacted] weeks gestation  Anatomy scan-[redacted] weeks gestation  Glucose tolerance test, blood work to test for anemia, T-dap vaccine-[redacted] weeks gestation  Vaginal swabs/cultures-STD/Group B strep-[redacted] weeks gestation  Appointments every 4 weeks until 28 weeks  Every 2 weeks from 28 weeks until 36 weeks  Weekly visits from 19 weeks until delivery  First Trimester of Pregnancy  The first trimester of pregnancy is from week 1 until the end of week 13 (months 1 through 3). During this time, your baby will begin to develop inside you. At 6-8 weeks, the eyes and face are formed, and the heartbeat can be seen on ultrasound. At the end of 12 weeks, all the baby's organs are formed. Prenatal care is all the medical care you receive before the birth of your baby. Make sure you get good prenatal care and follow all of your doctor's instructions. Follow these instructions at home: Medicines  Take over-the-counter and prescription medicines only as told by your doctor. Some medicines are safe and some medicines are not safe during pregnancy.  Take a prenatal vitamin that contains at least 600 micrograms (mcg) of folic acid.  If you have trouble pooping (constipation), take medicine that will make your stool soft (stool softener) if your doctor approves. Eating and drinking   Eat regular, healthy meals.  Your doctor will tell you the amount of weight gain that is right for you.  Avoid raw meat and uncooked cheese.  If you feel sick to your stomach (nauseous) or throw up (vomit): ? Eat 4 or 5 small meals a day instead of 3 large meals. ? Try eating a few soda crackers. ? Drink liquids between meals instead of during  meals.  To prevent constipation: ? Eat foods that are high in fiber, like fresh fruits and vegetables, whole grains, and beans. ? Drink enough fluids to keep your pee (urine) clear or pale yellow. Activity  Exercise only as told by your doctor. Stop exercising if you have cramps or pain in your lower belly (abdomen) or low back.  Do not exercise if it is too hot, too humid, or if you are in a place of great height (high altitude).  Try to avoid standing for long periods of time. Move your legs often if you must stand in one place for a long time.  Avoid heavy lifting.  Wear low-heeled shoes. Sit and stand up straight.  You can have sex unless your doctor tells you not to. Relieving pain and discomfort  Wear a good support bra if your breasts are sore.  Take warm water baths (sitz baths) to soothe pain or discomfort caused by hemorrhoids. Use hemorrhoid cream if your doctor says it is okay.  Rest with your legs raised if you have leg cramps or low back pain.  If you have puffy, bulging veins (varicose veins) in your legs: ? Wear support hose or compression stockings as told by your doctor. ? Raise (elevate) your feet for 15 minutes, 3-4 times a day. ? Limit salt in your food. Prenatal care  Schedule your prenatal visits by the twelfth week of pregnancy.  Write down your questions. Take them to your prenatal visits.  Keep all your prenatal visits as told by your  doctor. This is important. Safety  Wear your seat belt at all times when driving.  Make a list of emergency phone numbers. The list should include numbers for family, friends, the hospital, and police and fire departments. General instructions  Ask your doctor for a referral to a local prenatal class. Begin classes no later than at the start of month 6 of your pregnancy.  Ask for help if you need counseling or if you need help with nutrition. Your doctor can give you advice or tell you where to go for help.  Do  not use hot tubs, steam rooms, or saunas.  Do not douche or use tampons or scented sanitary pads.  Do not cross your legs for long periods of time.  Avoid all herbs and alcohol. Avoid drugs that are not approved by your doctor.  Do not use any tobacco products, including cigarettes, chewing tobacco, and electronic cigarettes. If you need help quitting, ask your doctor. You may get counseling or other support to help you quit.  Avoid cat litter boxes and soil used by cats. These carry germs that can cause birth defects in the baby and can cause a loss of your baby (miscarriage) or stillbirth.  Visit your dentist. At home, brush your teeth with a soft toothbrush. Be gentle when you floss. Contact a doctor if:  You are dizzy.  You have mild cramps or pressure in your lower belly.  You have a nagging pain in your belly area.  You continue to feel sick to your stomach, you throw up, or you have watery poop (diarrhea).  You have a bad smelling fluid coming from your vagina.  You have pain when you pee (urinate).  You have increased puffiness (swelling) in your face, hands, legs, or ankles. Get help right away if:  You have a fever.  You are leaking fluid from your vagina.  You have spotting or bleeding from your vagina.  You have very bad belly cramping or pain.  You gain or lose weight rapidly.  You throw up blood. It may look like coffee grounds.  You are around people who have German measles, fifth disease, or chickenpox.  You have a very bad headache.  You have shortness of breath.  You have any kind of trauma, such as from a fall or a car accident. Summary  The first trimester of pregnancy is from week 1 until the end of week 13 (months 1 through 3).  To take care of yourself and your unborn baby, you will need to eat healthy meals, take medicines only if your doctor tells you to do so, and do activities that are safe for you and your baby.  Keep all follow-up  visits as told by your doctor. This is important as your doctor will have to ensure that your baby is healthy and growing well. This information is not intended to replace advice given to you by your health care provider. Make sure you discuss any questions you have with your health care provider. Document Revised: 02/25/2019 Document Reviewed: 11/12/2016 Elsevier Patient Education  2020 [employer] Commonly Asked Questions During Pregnancy  Cats: A parasite can be excreted in cat feces.  To avoid exposure you need to have another person empty the little box.  If you must empty the litter box you will need to wear gloves.  Wash your hands after handling your cat.  This parasite can also be found in raw or undercooked meat so this should also be   avoided.  Colds, Sore Throats, Flu: Please check your medication sheet to see what you can take for symptoms.  If your symptoms are unrelieved by these medications please call the office.  Dental Work: Most any dental work your dentist recommends is permitted.  X-rays should only be taken during the first trimester if absolutely necessary.  Your abdomen should be shielded with a lead apron during all x-rays.  Please notify your provider prior to receiving any x-rays.  Novocaine is fine; gas is not recommended.  If your dentist requires a note from us prior to dental work please call the office and we will provide one for you.  Exercise: Exercise is an important part of staying healthy during your pregnancy.  You may continue most exercises you were accustomed to prior to pregnancy.  Later in your pregnancy you will most likely notice you have difficulty with activities requiring balance like riding a bicycle.  It is important that you listen to your body and avoid activities that put you at a higher risk of falling.  Adequate rest and staying well hydrated are a must!  If you have questions about the safety of specific activities ask your provider.     Exposure to Children with illness: Try to avoid obvious exposure; report any symptoms to us when noted,  If you have chicken pos, red measles or mumps, you should be immune to these diseases.   Please do not take any vaccines while pregnant unless you have checked with your OB provider.  Fetal Movement: After 28 weeks we recommend you do "kick counts" twice daily.  Lie or sit down in a calm quiet environment and count your baby movements "kicks".  You should feel your baby at least 10 times per hour.  If you have not felt 10 kicks within the first hour get up, walk around and have something sweet to eat or drink then repeat for an additional hour.  If count remains less than 10 per hour notify your provider.  Fumigating: Follow your pest control agent's advice as to how long to stay out of your home.  Ventilate the area well before re-entering.  Hemorrhoids:   Most over-the-counter preparations can be used during pregnancy.  Check your medication to see what is safe to use.  It is important to use a stool softener or fiber in your diet and to drink lots of liquids.  If hemorrhoids seem to be getting worse please call the office.   Hot Tubs:  Hot tubs Jacuzzis and saunas are not recommended while pregnant.  These increase your internal body temperature and should be avoided.  Intercourse:  Sexual intercourse is safe during pregnancy as long as you are comfortable, unless otherwise advised by your provider.  Spotting may occur after intercourse; report any bright red bleeding that is heavier than spotting.  Labor:  If you know that you are in labor, please go to the hospital.  If you are unsure, please call the office and let us help you decide what to do.  Lifting, straining, etc:  If your job requires heavy lifting or straining please check with your provider for any limitations.  Generally, you should not lift items heavier than that you can lift simply with your hands and arms (no back  muscles)  Painting:  Paint fumes do not harm your pregnancy, but may make you ill and should be avoided if possible.  Latex or water based paints have less odor than oils.  Use   adequate ventilation while painting.  Permanents & Hair Color:  Chemicals in hair dyes are not recommended as they cause increase hair dryness which can increase hair loss during pregnancy.  " Highlighting" and permanents are allowed.  Dye may be absorbed differently and permanents may not hold as well during pregnancy.  Sunbathing:  Use a sunscreen, as skin burns easily during pregnancy.  Drink plenty of fluids; avoid over heating.  Tanning Beds:  Because their possible side effects are still unknown, tanning beds are not recommended.  Ultrasound Scans:  Routine ultrasounds are performed at approximately 20 weeks.  You will be able to see your baby's general anatomy an if you would like to know the gender this can usually be determined as well.  If it is questionable when you conceived you may also receive an ultrasound early in your pregnancy for dating purposes.  Otherwise ultrasound exams are not routinely performed unless there is a medical necessity.  Although you can request a scan we ask that you pay for it when conducted because insurance does not cover " patient request" scans.  Work: If your pregnancy proceeds without complications you may work until your due date, unless your physician or employer advises otherwise.  Round Ligament Pain/Pelvic Discomfort:  Sharp, shooting pains not associated with bleeding are fairly common, usually occurring in the second trimester of pregnancy.  They tend to be worse when standing up or when you remain standing for long periods of time.  These are the result of pressure of certain pelvic ligaments called "round ligaments".  Rest, Tylenol and heat seem to be the most effective relief.  As the womb and fetus grow, they rise out of the pelvis and the discomfort improves.  Please  notify the office if your pain seems different than that described.  It may represent a more serious condition.  Common Medications Safe in Pregnancy  Acne:      Constipation:  Benzoyl Peroxide     Colace  Clindamycin      Dulcolax Suppository  Topica Erythromycin     Fibercon  Salicylic Acid      Metamucil         Miralax AVOID:        Senakot   Accutane    Cough:  Retin-A       Cough Drops  Tetracycline      Phenergan w/ Codeine if Rx  Minocycline      Robitussin (Plain & DM)  Antibiotics:     Crabs/Lice:  Ceclor       RID  Cephalosporins    AVOID:  E-Mycins      Kwell  Keflex  Macrobid/Macrodantin   Diarrhea:  Penicillin      Kao-Pectate  Zithromax      Imodium AD         PUSH FLUIDS AVOID:       Cipro     Fever:  Tetracycline      Tylenol (Regular or Extra  Minocycline       Strength)  Levaquin      Extra Strength-Do not          Exceed 8 tabs/24 hrs Caffeine:        <285m/day (equiv. To 1 cup of coffee or  approx. 3 12 oz sodas)         Gas: Cold/Hayfever:       Gas-X  Benadryl      Mylicon  Claritin       Phazyme  **  Claritin-D        Chlor-Trimeton    Headaches:  Dimetapp      ASA-Free Excedrin  Drixoral-Non-Drowsy     Cold Compress  Mucinex (Guaifenasin)     Tylenol (Regular or Extra  Sudafed/Sudafed-12 Hour     Strength)  **Sudafed PE Pseudoephedrine   Tylenol Cold & Sinus     Vicks Vapor Rub  Zyrtec  **AVOID if Problems With Blood Pressure         Heartburn: Avoid lying down for at least 1 hour after meals  Aciphex      Maalox     Rash:  Milk of Magnesia     Benadryl    Mylanta       1% Hydrocortisone Cream  Pepcid  Pepcid Complete   Sleep Aids:  Prevacid      Ambien   Prilosec       Benadryl  Rolaids       Chamomile Tea  Tums (Limit 4/day)     Unisom         Tylenol PM         Warm milk-add vanilla or  Hemorrhoids:       Sugar for taste  Anusol/Anusol H.C.  (RX: Analapram 2.5%)  Sugar Substitutes:  Hydrocortisone OTC     Ok in  moderation  Preparation H      Tucks        Vaseline lotion applied to tissue with wiping    Herpes:     Throat:  Acyclovir      Oragel  Famvir  Valtrex     Vaccines:         Flu Shot Leg Cramps:       *Gardasil  Benadryl      Hepatitis A         Hepatitis B Nasal Spray:       Pneumovax  Saline Nasal Spray     Polio Booster         Tetanus Nausea:       Tuberculosis test or PPD  Vitamin B6 25 mg TID   AVOID:    Dramamine      *Gardasil  Emetrol       Live Poliovirus  Ginger Root 250 mg QID    MMR (measles, mumps &  High Complex Carbs @ Bedtime    rebella)  Sea Bands-Accupressure    Varicella (Chickenpox)  Unisom 1/2 tab TID     *No known complications           If received before Pain:         Known pregnancy;   Darvocet       Resume series after  Lortab        Delivery  Percocet    Yeast:   Tramadol      Femstat  Tylenol 3      Gyne-lotrimin  Ultram       Monistat  Vicodin           MISC:         All Sunscreens           Hair Coloring/highlights          Insect Repellant's          (Including DEET)         Mystic Tans

## 2020-10-23 NOTE — Progress Notes (Unsigned)
NOB PE- present for initial prenatal care. Pt stated that she was doing well.

## 2020-10-24 ENCOUNTER — Encounter: Payer: Self-pay | Admitting: Obstetrics and Gynecology

## 2020-10-24 ENCOUNTER — Ambulatory Visit (INDEPENDENT_AMBULATORY_CARE_PROVIDER_SITE_OTHER): Payer: Medicaid Other | Admitting: Obstetrics and Gynecology

## 2020-10-24 ENCOUNTER — Other Ambulatory Visit (INDEPENDENT_AMBULATORY_CARE_PROVIDER_SITE_OTHER): Payer: Medicaid Other

## 2020-10-24 ENCOUNTER — Other Ambulatory Visit: Payer: Self-pay

## 2020-10-24 VITALS — BP 121/86 | HR 81 | Ht 62.0 in | Wt 96.3 lb

## 2020-10-24 DIAGNOSIS — Z3A11 11 weeks gestation of pregnancy: Secondary | ICD-10-CM

## 2020-10-24 DIAGNOSIS — R87612 Low grade squamous intraepithelial lesion on cytologic smear of cervix (LGSIL): Secondary | ICD-10-CM

## 2020-10-24 DIAGNOSIS — Z87891 Personal history of nicotine dependence: Secondary | ICD-10-CM

## 2020-10-24 DIAGNOSIS — N870 Mild cervical dysplasia: Secondary | ICD-10-CM

## 2020-10-24 DIAGNOSIS — Z113 Encounter for screening for infections with a predominantly sexual mode of transmission: Secondary | ICD-10-CM

## 2020-10-24 DIAGNOSIS — Z3687 Encounter for antenatal screening for uncertain dates: Secondary | ICD-10-CM | POA: Diagnosis not present

## 2020-10-24 DIAGNOSIS — Z3401 Encounter for supervision of normal first pregnancy, first trimester: Secondary | ICD-10-CM | POA: Diagnosis not present

## 2020-10-24 DIAGNOSIS — Z3481 Encounter for supervision of other normal pregnancy, first trimester: Secondary | ICD-10-CM | POA: Diagnosis not present

## 2020-10-24 NOTE — Progress Notes (Signed)
OBSTETRIC INITIAL PRENATAL VISIT  Subjective:    Kathleen Romero is being seen today for her first obstetrical visit.  This is a planned pregnancy. She is a 34 y.o. G2P1001 female at [redacted]w[redacted]d gestation, Estimated Date of Delivery: 05/09/21 with Patient's last menstrual period was 08/01/2020 (exact date).  Pregnancy was confirmed at [facility] Department. Her obstetrical history is significant for former smoker (recently quit after discovery of pregnancy, was using ~ 1-2 blunts per day). Relationship with FOB: spouse, living together.  Married x 1 year.  Patient does intend to breast feed. Pregnancy history fully reviewed.   OB History  Gravida Para Term Preterm AB Living  2 1 1  0 0 1  SAB TAB Ectopic Multiple Live Births  0 0 0 0 1    # Outcome Date GA Lbr Len/2nd Weight Sex Delivery Anes PTL Lv  2 Current           1 Term 03/06/13 [redacted]w[redacted]d 07:33 / 00:15 6 lb 8.9 oz (2.974 kg) F Vag-Spont EPI  LIV     Name: KEYARRA, RENDALL     Apgar1: 9  Apgar5: 9    Gynecologic History:  Last pap smear was 01/21/2020.  Results were abnormal, LGSIL.  Reports h/o abnormal pap smears in the past (LGSIL pap also noted 10/19/2018, followed by colposcopy on 12/11/2018 - CIN I on biopsies with negative ECC). Denies history of STIs.  Contraception: None. Previously on Depo Provera for 6 years, discontinued in March.   Past Medical History:  Diagnosis Date  . Infection    Yeast;was treated 08/15/12 w/ Monistat 7  . Infection    BV x 2  . No pertinent past medical history   . Vaginal Pap smear, abnormal     Family History  Problem Relation Age of Onset  . Hypertension Mother   . Hypertension Father        deceased  . Alcohol abuse Father   . Other Other        Huntington's Chorea;MGF, M. Uncle(deceased);M. Aunt (showing signs of)  . Asthma Paternal Uncle        x 2  . Breast cancer Paternal Aunt     Past Surgical History:  Procedure Laterality Date  . denies    . NO PAST SURGERIES       Social History   Socioeconomic History  . Marital status: Married    Spouse name: Not on file  . Number of children: Not on file  . Years of education: 7  . Highest education level: Not on file  Occupational History  . Occupation: 19: UNEMPLOYED  Tobacco Use  . Smoking status: Former Smoker    Packs/day: 0.25    Years: 12.00    Pack years: 3.00    Types: Cigars  . Smokeless tobacco: Never Used  Vaping Use  . Vaping Use: Never used  Substance and Sexual Activity  . Alcohol use: Not Currently    Comment: last use- months ago  . Drug use: Not Currently    Frequency: 1.0 times per week    Types: Marijuana    Comment: every day use, but cutting down  . Sexual activity: Yes    Partners: Male    Birth control/protection: None  Other Topics Concern  . Not on file  Social History Narrative  . Not on file   Social Determinants of Health   Financial Resource Strain:   . Difficulty of Paying Living Expenses: Not  on file  Food Insecurity:   . Worried About Programme researcher, broadcasting/film/video in the Last Year: Not on file  . Ran Out of Food in the Last Year: Not on file  Transportation Needs:   . Lack of Transportation (Medical): Not on file  . Lack of Transportation (Non-Medical): Not on file  Physical Activity:   . Days of Exercise per Week: Not on file  . Minutes of Exercise per Session: Not on file  Stress:   . Feeling of Stress : Not on file  Social Connections:   . Frequency of Communication with Friends and Family: Not on file  . Frequency of Social Gatherings with Friends and Family: Not on file  . Attends Religious Services: Not on file  . Active Member of Clubs or Organizations: Not on file  . Attends Banker Meetings: Not on file  . Marital Status: Not on file  Intimate Partner Violence: Not At Risk  . Fear of Current or Ex-Partner: No  . Emotionally Abused: No  . Physically Abused: No  . Sexually Abused: No    Current Outpatient  Medications on File Prior to Visit  Medication Sig Dispense Refill  . Prenatal Vit-Fe Fumarate-FA (MULTIVITAMIN-PRENATAL) 27-0.8 MG TABS tablet Take 1 tablet by mouth daily at 12 noon. 100 tablet 0   No current facility-administered medications on file prior to visit.    No Known Allergies   Review of Systems General: Not Present- Fever, Weight Loss and Weight Gain. Skin: Not Present- Rash. HEENT: Not Present- Blurred Vision, Headache and Bleeding Gums. Respiratory: Not Present- Difficulty Breathing. Breast: Not Present- Breast Mass. Cardiovascular: Not Present- Chest Pain, Elevated Blood Pressure, Fainting / Blacking Out and Shortness of Breath. Gastrointestinal: Not Present- Abdominal Pain, Constipation, Nausea and Vomiting. Female Genitourinary: Not Present- Frequency, Painful Urination, Pelvic Pain, Vaginal Bleeding, Vaginal Discharge, Contractions, regular, Fetal Movements Decreased, Urinary Complaints and Vaginal Fluid. Musculoskeletal: Not Present- Back Pain and Leg Cramps. Neurological: Not Present- Dizziness. Psychiatric: Not Present- Depression.    Objective:   Blood pressure 121/86, pulse 81, weight 96 lb 4.8 oz (43.7 kg), last menstrual period 08/01/2020.  Body mass index is 17.61 kg/m.  General Appearance:    Alert, cooperative, no distress, appears stated age  Head:    Normocephalic, without obvious abnormality, atraumatic  Eyes:    PERRL, conjunctiva/corneas clear, EOM's intact, both eyes  Ears:    Normal external ear canals, both ears  Nose:   Nares normal, septum midline, mucosa normal, no drainage or sinus tenderness  Throat:   Lips, mucosa, and tongue normal; teeth and gums normal  Neck:   Supple, symmetrical, trachea midline, no adenopathy; thyroid: no enlargement/tenderness/nodules; no carotid bruit or JVD  Back:     Symmetric, no curvature, ROM normal, no CVA tenderness  Lungs:     Clear to auscultation bilaterally, respirations unlabored  Chest Wall:    No  tenderness or deformity   Heart:    Regular rate and rhythm, S1 and S2 normal, no murmur, rub or gallop  Breast Exam:    No tenderness, masses, or nipple abnormality. Fibrocystic breast changes noted bilaterally.   Abdomen:     Soft, non-tender, bowel sounds active all four quadrants, no masses, no organomegaly.  FH 12.  FHT 157  bpm.  Genitalia:    Pelvic:external genitalia normal, vagina without lesions, discharge, or tenderness, rectovaginal septum  normal. Cervix normal in appearance, no cervical motion tenderness, no adnexal masses or tenderness.  Pregnancy positive findings:  uterine enlargement: 12 wk size, nontender.   Rectal:    Normal external sphincter.  No hemorrhoids appreciated. Internal exam not done.   Extremities:   Extremities normal, atraumatic, no cyanosis or edema  Pulses:   2+ and symmetric all extremities  Skin:   Skin color, texture, turgor normal, no rashes or lesions  Lymph nodes:   Cervical, supraclavicular, and axillary nodes normal  Neurologic:   CNII-XII intact, normal strength, sensation and reflexes throughout    Assessment:   1. Encounter for supervision of other normal pregnancy in first trimester   2. [redacted] weeks gestation of pregnancy   3. Screening examination for STD (sexually transmitted disease)   4. LGSIL on Pap smear of cervix   5. Dysplasia of cervix, low grade (CIN 1)   6. Quit smoking     Plan:   1. Supervision of normal pregnancy  - Initial labs ordered.  - Prenatal vitamins encouraged. - Problem list reviewed and updated. - New OB counseling:  The patient has been given an overview regarding routine prenatal care.  Recommendations regarding diet, weight gain, and exercise in pregnancy were given. - Prenatal testing, optional genetic testing, and ultrasound use in pregnancy were reviewed.  Cell-free DNA testing discussed: ordered Panorama and Horizon. - Benefits of Breast Feeding were discussed. The patient is encouraged to consider nursing her  baby post partum. - For dating scan later this morning to confirm dates and number of fetuses.   2. LGSIL pap (with h/o CIN I) - patient had repeat pap with LGSIL, has not had follow up colposcopy. Discussed the importance of cervical surveillance in the setting of an abnormal pap smear.  Will perform colposcopy at [redacted] weeks gestation.   3. Quit smoking - Was smoking 1-2 "Black and Mild" daily prior to pregnancy, but cessation noted at discovery of pregnancy.   Follow up in 4 weeks.  The patient has Medicaid.  [facility] Medicaid Risk Screening Form completed today   50% of 30 min visit spent on counseling and coordination of care.   Hildred Laser, MD [facility]

## 2020-10-25 LAB — TOXOPLASMA ANTIBODIES- IGG AND  IGM
Toxoplasma Antibody- IgM: <3 AU/mL (ref 0.0–7.9)
Toxoplasma IgG Ratio: <3 IU/mL (ref 0.0–7.1)

## 2020-10-25 LAB — CBC
Hematocrit: 38.3 % (ref 34.0–46.6)
Hemoglobin: 12.8 g/dL (ref 11.1–15.9)
MCH: 29.8 pg (ref 26.6–33.0)
MCHC: 33.4 g/dL (ref 31.5–35.7)
MCV: 89 fL (ref 79–97)
Platelets: 125 10*3/uL — ABNORMAL LOW (ref 150–450)
RBC: 4.3 x10E6/uL (ref 3.77–5.28)
RDW: 12.9 % (ref 11.7–15.4)
WBC: 8.7 10*3/uL (ref 3.4–10.8)

## 2020-10-25 LAB — HIV ANTIBODY (ROUTINE TESTING W REFLEX): HIV Screen 4th Generation wRfx: NONREACTIVE

## 2020-10-25 LAB — URINALYSIS, ROUTINE W REFLEX MICROSCOPIC
Bilirubin, UA: NEGATIVE
Glucose, UA: NEGATIVE
Ketones, UA: NEGATIVE
Leukocytes,UA: NEGATIVE
Nitrite, UA: NEGATIVE
Protein,UA: NEGATIVE
RBC, UA: NEGATIVE
Specific Gravity, UA: 1.018 (ref 1.005–1.030)
Urobilinogen, Ur: 0.2 mg/dL (ref 0.2–1.0)
pH, UA: 8 — ABNORMAL HIGH (ref 5.0–7.5)

## 2020-10-25 LAB — HEPATITIS C ANTIBODY: Hep C Virus Ab: <0.1 s/co ratio (ref 0.0–0.9)

## 2020-10-25 LAB — ABO AND RH: Rh Factor: POSITIVE

## 2020-10-25 LAB — VARICELLA ZOSTER ANTIBODY, IGG: Varicella zoster IgG: <135 index — ABNORMAL LOW (ref >165)

## 2020-10-25 LAB — RUBELLA SCREEN: Rubella Antibodies, IGG: 1.44 index (ref >0.99)

## 2020-10-25 LAB — HEPATITIS B SURFACE ANTIGEN: Hepatitis B Surface Ag: NEGATIVE

## 2020-10-25 LAB — RPR: RPR Ser Ql: NONREACTIVE

## 2020-10-26 LAB — CULTURE, OB URINE

## 2020-10-26 LAB — URINE CULTURE, OB REFLEX

## 2020-10-27 LAB — DRUG PROFILE, UR, 9 DRUGS (LABCORP)
Amphetamines, Urine: NEGATIVE ng/mL
Barbiturate Quant, Ur: NEGATIVE ng/mL
Benzodiazepine Quant, Ur: NEGATIVE ng/mL
Cannabinoid Quant, Ur: NEGATIVE ng/mL
Cocaine (Metab.): NEGATIVE ng/mL
Methadone Screen, Urine: NEGATIVE ng/mL
Opiate Quant, Ur: NEGATIVE ng/mL
PCP Quant, Ur: NEGATIVE ng/mL
Propoxyphene: NEGATIVE ng/mL

## 2020-10-27 LAB — NICOTINE SCREEN, URINE: Cotinine Ql Scrn, Ur: NEGATIVE ng/mL

## 2020-10-28 LAB — GC/CHLAMYDIA PROBE AMP
Chlamydia trachomatis, NAA: NEGATIVE
Neisseria Gonorrhoeae by PCR: NEGATIVE

## 2020-11-02 ENCOUNTER — Telehealth: Payer: Self-pay

## 2020-11-02 NOTE — Telephone Encounter (Signed)
Pt called and is aware that her Avelina Laine test came back negative with low risk and female fetus.

## 2020-11-18 DIAGNOSIS — Z419 Encounter for procedure for purposes other than remedying health state, unspecified: Secondary | ICD-10-CM | POA: Diagnosis not present

## 2020-11-18 NOTE — L&D Delivery Note (Signed)
       Delivery Note   Kathleen Romero is a 36 y.o. G2P2002 at [redacted]w[redacted]d Estimated Date of Delivery: 05/09/21  PRE-OPERATIVE DIAGNOSIS:  1) [redacted]w[redacted]d pregnancy.  2) Labor  POST-OPERATIVE DIAGNOSIS:  1) [redacted]w[redacted]d pregnancy s/p Vaginal, Spontaneous  2) Viable infant.  Delivery Type: Vaginal, Spontaneous    Delivery Anesthesia: None   Labor Complications:      ESTIMATED BLOOD LOSS: 100  ml    FINDINGS:   1) Viable infant, Apgar scores of 8   at 1 minute and 9   at 5 minutes and a birthweight of 96.65  ounces.    2) Nuchal cord: No  SPECIMENS:   PLACENTA:   Appearance: Intact    Removal: Spontaneous;Pathology      Disposition:    DISPOSITION:  Infant to left in stable condition in the delivery room, with L&D personnel and mother,  NARRATIVE SUMMARY: Labor course:  [name] is a [id number] at [redacted]w[redacted]d who presented for labor management.  She progressed well in labor without pitocin.  She received the appropriate anesthesia and proceeded to complete dilation. She evidenced good maternal expulsive effort during the second stage. She went on to deliver a viable infant. The placenta delivered without problems and was noted to be complete. A perineal and vaginal examination was performed. Episiotomy/Lacerations: None    [name], M.D. 05/08/2021 9:54 AM 2)

## 2020-11-28 ENCOUNTER — Other Ambulatory Visit: Payer: Self-pay

## 2020-11-28 ENCOUNTER — Ambulatory Visit (INDEPENDENT_AMBULATORY_CARE_PROVIDER_SITE_OTHER): Payer: Medicaid Other | Admitting: Obstetrics and Gynecology

## 2020-11-28 ENCOUNTER — Encounter: Payer: Self-pay | Admitting: Obstetrics and Gynecology

## 2020-11-28 VITALS — BP 108/65 | HR 84 | Wt 102.8 lb

## 2020-11-28 DIAGNOSIS — Z3A16 16 weeks gestation of pregnancy: Secondary | ICD-10-CM

## 2020-11-28 DIAGNOSIS — Z3481 Encounter for supervision of other normal pregnancy, first trimester: Secondary | ICD-10-CM

## 2020-11-28 LAB — POCT URINALYSIS DIPSTICK OB
Bilirubin, UA: NEGATIVE
Blood, UA: NEGATIVE
Glucose, UA: NEGATIVE
Ketones, UA: NEGATIVE
Leukocytes, UA: NEGATIVE
Nitrite, UA: NEGATIVE
POC,PROTEIN,UA: NEGATIVE
Spec Grav, UA: 1.01 (ref 1.010–1.025)
Urobilinogen, UA: 0.2 E.U./dL
pH, UA: 7 (ref 5.0–8.0)

## 2020-11-28 NOTE — Progress Notes (Signed)
ROB: Patient has no concerns.  Desires AFP today.  Patient remains unvaccinated- "not convinced that it safe" -"they developed a cure too quickly".  Discussed rationale for pregnancy vaccination and the high risk nature of COVID infection during pregnancy.  FAS next visit.

## 2020-11-30 LAB — AFP, SERUM, OPEN SPINA BIFIDA
AFP MoM: 1.27
AFP Value: 56 ng/mL
Gest. Age on Collection Date: 16 weeks
Maternal Age At EDD: 33.9 yr
OSBR Risk 1 IN: 5251
Test Results:: NEGATIVE
Weight: 102 [lb_av]

## 2020-12-13 ENCOUNTER — Encounter: Payer: Self-pay | Admitting: Obstetrics and Gynecology

## 2020-12-13 ENCOUNTER — Other Ambulatory Visit: Payer: Self-pay

## 2020-12-13 ENCOUNTER — Ambulatory Visit (INDEPENDENT_AMBULATORY_CARE_PROVIDER_SITE_OTHER): Payer: Medicaid Other | Admitting: Obstetrics and Gynecology

## 2020-12-13 VITALS — BP 110/74 | HR 78 | Ht 62.0 in | Wt 106.7 lb

## 2020-12-13 DIAGNOSIS — R87612 Low grade squamous intraepithelial lesion on cytologic smear of cervix (LGSIL): Secondary | ICD-10-CM

## 2020-12-13 DIAGNOSIS — Z3A19 19 weeks gestation of pregnancy: Secondary | ICD-10-CM | POA: Diagnosis not present

## 2020-12-13 NOTE — Progress Notes (Signed)
OB-Pt present for colposcopy. Pt stated that she was doing well no problems.

## 2020-12-13 NOTE — Patient Instructions (Addendum)

## 2020-12-13 NOTE — Progress Notes (Signed)
     GYNECOLOGY OFFICE COLPOSCOPY PROCEDURE NOTE  35 y.o. G2P1001 at [redacted]w[redacted]d gestation here for colposcopy for low-grade squamous intraepithelial neoplasia (LGSIL - encompassing HPV,mild dysplasia,CIN I) pap smear on 01/21/2020 (did not follow up for colposcopy after this pap). Discussed role for HPV in cervical dysplasia, need for surveillance.  Patient gave informed written consent, time out was performed.  Placed in lithotomy position. Cervix viewed with speculum and colposcope after application of acetic acid.   Colposcopy adequate? Yes  HPV changes noted at 5-6 o'clock near cervical os; no corresponding biopsies obtained.  ECC specimen not obtained. All specimens were labeled and sent to pathology.  Patient was given post procedure instructions.  Will repeat pap smear in 3rd trimester, if still abnormal, will repeat colposcopy postpartum.  Routine preventative health maintenance measures emphasized.    Hildred Laser, MD [facility]

## 2020-12-19 DIAGNOSIS — Z419 Encounter for procedure for purposes other than remedying health state, unspecified: Secondary | ICD-10-CM | POA: Diagnosis not present

## 2021-01-02 ENCOUNTER — Encounter: Payer: Self-pay | Admitting: Obstetrics and Gynecology

## 2021-01-02 ENCOUNTER — Other Ambulatory Visit: Payer: Self-pay

## 2021-01-02 ENCOUNTER — Ambulatory Visit (INDEPENDENT_AMBULATORY_CARE_PROVIDER_SITE_OTHER): Payer: Medicaid Other

## 2021-01-02 ENCOUNTER — Ambulatory Visit (INDEPENDENT_AMBULATORY_CARE_PROVIDER_SITE_OTHER): Payer: Medicaid Other | Admitting: Obstetrics and Gynecology

## 2021-01-02 ENCOUNTER — Other Ambulatory Visit: Payer: Self-pay | Admitting: Obstetrics and Gynecology

## 2021-01-02 VITALS — BP 109/72 | HR 75 | Ht 62.0 in | Wt 108.2 lb

## 2021-01-02 DIAGNOSIS — Z09 Encounter for follow-up examination after completed treatment for conditions other than malignant neoplasm: Secondary | ICD-10-CM

## 2021-01-02 DIAGNOSIS — IMO0002 Reserved for concepts with insufficient information to code with codable children: Secondary | ICD-10-CM | POA: Insufficient documentation

## 2021-01-02 DIAGNOSIS — IMO0001 Reserved for inherently not codable concepts without codable children: Secondary | ICD-10-CM

## 2021-01-02 DIAGNOSIS — O358XX0 Maternal care for other (suspected) fetal abnormality and damage, not applicable or unspecified: Secondary | ICD-10-CM

## 2021-01-02 DIAGNOSIS — Z3A21 21 weeks gestation of pregnancy: Secondary | ICD-10-CM

## 2021-01-02 DIAGNOSIS — Z3482 Encounter for supervision of other normal pregnancy, second trimester: Secondary | ICD-10-CM

## 2021-01-02 LAB — POCT URINALYSIS DIPSTICK OB
Bilirubin, UA: NEGATIVE
Blood, UA: NEGATIVE
Glucose, UA: NEGATIVE
Ketones, UA: NEGATIVE
Leukocytes, UA: NEGATIVE
Nitrite, UA: NEGATIVE
POC,PROTEIN,UA: NEGATIVE
Spec Grav, UA: 1.01 (ref 1.010–1.025)
Urobilinogen, UA: 0.2 E.U./dL
pH, UA: 7.5 (ref 5.0–8.0)

## 2021-01-02 NOTE — Patient Instructions (Signed)

## 2021-01-02 NOTE — Progress Notes (Signed)
OB-Pt present for routine prenatal care. Pt stated that she was doing well. Pt declined flu vaccine.

## 2021-01-02 NOTE — Progress Notes (Signed)
ROB: Doing well no complaints. S/p anatomy scan, normal except EIF noted. Will f/u at 28 weeks. Discussed circumcision for female fetus, thinks she will have it performed.  Discussed breastfeeding  Had colposcopy several weeks ago for LGSIL pap in 2020, to f/u postpartum with colpo. Can repeat pap in March with 36 week labs. RTC in 4 weeks.   The following were addressed during this visit:  Breastfeeding Education - Early initiation of breastfeeding    Comments: Keeps milk supply adequate, helps contract uterus and slow bleeding, and early milk is the perfect first food and is easy to digest.   - The importance of exclusive breastfeeding    Comments: Provides antibodies, Lower risk of breast and ovarian cancers, and type-2 diabetes,Helps your body recover, Reduced chance of SIDS.   - Risks of giving your baby anything other than breast milk if you are breastfeeding    Comments: Make the baby less content with breastfeeds, may make my baby more susceptible to illness, and may reduce my milk supply.   - Feeding on demand or baby-led feeding    Comments: Helps prevent breastfeeding complications, helps bring in good milk supply, prevents under or overfeeding, and helps baby feel content and satisfied   - Frequent feeding to help assure optimal milk production    Comments: Making a full supply of milk requires frequent removal of milk from breasts, infant will eat 8-12 times in 24 hours, if separated from infant use breast massage, hand expression and/ or pumping to remove milk from breasts.   - Effective positioning and attachment    Comments: Helps my baby to get enough breast milk, helps to produce an adequate milk supply, and helps prevent nipple pain and damage   - Exclusive breastfeeding for the first 6 months    Comments: Builds a healthy milk supply and keeps it up, protects baby from sickness and disease, and breastmilk has everything your baby needs for the first 6 months.  -  Individualized Education    Comments: Contraindications to breastfeeding and other special medical conditions

## 2021-01-16 DIAGNOSIS — Z419 Encounter for procedure for purposes other than remedying health state, unspecified: Secondary | ICD-10-CM | POA: Diagnosis not present

## 2021-01-24 ENCOUNTER — Other Ambulatory Visit: Payer: Self-pay

## 2021-01-24 DIAGNOSIS — Z3482 Encounter for supervision of other normal pregnancy, second trimester: Secondary | ICD-10-CM

## 2021-01-24 DIAGNOSIS — IMO0001 Reserved for inherently not codable concepts without codable children: Secondary | ICD-10-CM

## 2021-01-24 DIAGNOSIS — O358XX0 Maternal care for other (suspected) fetal abnormality and damage, not applicable or unspecified: Secondary | ICD-10-CM

## 2021-01-26 ENCOUNTER — Encounter: Payer: Self-pay | Admitting: Obstetrics and Gynecology

## 2021-01-30 ENCOUNTER — Other Ambulatory Visit: Payer: Self-pay

## 2021-01-30 ENCOUNTER — Ambulatory Visit (INDEPENDENT_AMBULATORY_CARE_PROVIDER_SITE_OTHER): Payer: Medicaid Other | Admitting: Obstetrics and Gynecology

## 2021-01-30 ENCOUNTER — Encounter: Payer: Self-pay | Admitting: Obstetrics and Gynecology

## 2021-01-30 VITALS — BP 110/72 | HR 82 | Wt 115.0 lb

## 2021-01-30 DIAGNOSIS — Z3A25 25 weeks gestation of pregnancy: Secondary | ICD-10-CM

## 2021-01-30 DIAGNOSIS — Z3482 Encounter for supervision of other normal pregnancy, second trimester: Secondary | ICD-10-CM

## 2021-01-30 LAB — POCT URINALYSIS DIPSTICK OB
Bilirubin, UA: NEGATIVE
Blood, UA: NEGATIVE
Glucose, UA: NEGATIVE
Ketones, UA: NEGATIVE
Leukocytes, UA: NEGATIVE
Nitrite, UA: NEGATIVE
POC,PROTEIN,UA: NEGATIVE
Spec Grav, UA: 1.015 (ref 1.010–1.025)
Urobilinogen, UA: 0.2 E.U./dL
pH, UA: 6 (ref 5.0–8.0)

## 2021-01-30 NOTE — Progress Notes (Signed)
ROB: No complaints.  Feels daily fetal movement.  1 hour GCT next visit.  28-week ultrasound scheduled for follow-up of EIF.

## 2021-02-16 DIAGNOSIS — Z419 Encounter for procedure for purposes other than remedying health state, unspecified: Secondary | ICD-10-CM | POA: Diagnosis not present

## 2021-02-22 ENCOUNTER — Other Ambulatory Visit: Payer: Self-pay | Admitting: Obstetrics and Gynecology

## 2021-02-22 DIAGNOSIS — O283 Abnormal ultrasonic finding on antenatal screening of mother: Secondary | ICD-10-CM

## 2021-02-22 DIAGNOSIS — Z3482 Encounter for supervision of other normal pregnancy, second trimester: Secondary | ICD-10-CM

## 2021-02-26 ENCOUNTER — Other Ambulatory Visit: Payer: Self-pay

## 2021-02-26 DIAGNOSIS — Z3483 Encounter for supervision of other normal pregnancy, third trimester: Secondary | ICD-10-CM

## 2021-02-26 NOTE — Patient Instructions (Signed)
WHAT OB PATIENTS CAN EXPECT   Confirmation of pregnancy and ultrasound ordered if medically indicated-[redacted] weeks gestation  New OB (NOB) intake with nurse and New OB (NOB) labs- [redacted] weeks gestation  New OB (NOB) physical examination with provider- 11/[redacted] weeks gestation  Flu vaccine-[redacted] weeks gestation  Anatomy scan-[redacted] weeks gestation  Glucose tolerance test, blood work to test for anemia, T-dap vaccine-[redacted] weeks gestation  Vaginal swabs/cultures-STD/Group B strep-[redacted] weeks gestation  Appointments every 4 weeks until 28 weeks  Every 2 weeks from 28 weeks until 36 weeks  Weekly visits from 36 weeks until delivery  Common Medications Safe in Pregnancy  Acne:      Constipation:  Benzoyl Peroxide     Colace  Clindamycin      Dulcolax Suppository  Topica Erythromycin     Fibercon  Salicylic Acid      Metamucil         Miralax AVOID:        Senakot   Accutane    Cough:  Retin-A       Cough Drops  Tetracycline      Phenergan w/ Codeine if Rx  Minocycline      Robitussin (Plain & DM)  Antibiotics:     Crabs/Lice:  Ceclor       RID  Cephalosporins    AVOID:  E-Mycins      Kwell  Keflex  Macrobid/Macrodantin   Diarrhea:  Penicillin      Kao-Pectate  Zithromax      Imodium AD         PUSH FLUIDS AVOID:       Cipro     Fever:  Tetracycline      Tylenol (Regular or Extra  Minocycline       Strength)  Levaquin      Extra Strength-Do not          Exceed 8 tabs/24 hrs Caffeine:        <256m/day (equiv. To 1 cup of coffee or  approx. 3 12 oz sodas)         Gas: Cold/Hayfever:       Gas-X  Benadryl      Mylicon  Claritin       Phazyme  **Claritin-D        Chlor-Trimeton    Headaches:  Dimetapp      ASA-Free Excedrin  Drixoral-Non-Drowsy     Cold Compress  Mucinex (Guaifenasin)     Tylenol (Regular or Extra  Sudafed/Sudafed-12 Hour     Strength)  **Sudafed PE Pseudoephedrine   Tylenol Cold & Sinus     Vicks Vapor Rub  Zyrtec  **AVOID if Problems With Blood  Pressure         Heartburn: Avoid lying down for at least 1 hour after meals  Aciphex      Maalox     Rash:  Milk of Magnesia     Benadryl    Mylanta       1% Hydrocortisone Cream  Pepcid  Pepcid Complete   Sleep Aids:  Prevacid      Ambien   Prilosec       Benadryl  Rolaids       Chamomile Tea  Tums (Limit 4/day)     Unisom         Tylenol PM         Warm milk-add vanilla or  Hemorrhoids:       Sugar for taste  Anusol/Anusol H.C.  (RX: Analapram 2.5%)  Sugar Substitutes:  Hydrocortisone OTC     Ok in moderation  Preparation H      Tucks        Vaseline lotion applied to tissue with wiping    Herpes:     Throat:  Acyclovir      Oragel  Famvir  Valtrex     Vaccines:         Flu Shot Leg Cramps:       *Gardasil  Benadryl      Hepatitis A         Hepatitis B Nasal Spray:       Pneumovax  Saline Nasal Spray     Polio Booster         Tetanus Nausea:       Tuberculosis test or PPD  Vitamin B6 25 mg TID   AVOID:    Dramamine      *Gardasil  Emetrol       Live Poliovirus  Ginger Root 250 mg QID    MMR (measles, mumps &  High Complex Carbs @ Bedtime    rebella)  Sea Bands-Accupressure    Varicella (Chickenpox)  Unisom 1/2 tab TID     *No known complications           If received before Pain:         Known pregnancy;   Darvocet       Resume series after  Lortab        Delivery  Percocet    Yeast:   Tramadol      Femstat  Tylenol 3      Gyne-lotrimin  Ultram       Monistat  Vicodin           MISC:         All Sunscreens           Hair Coloring/highlights          Insect Repellant's          (Including DEET)         Mystic Tans Breastfeeding  Choosing to breastfeed is one of the best decisions you can make for yourself and your baby. A change in hormones during pregnancy causes your breasts to make breast milk in your milk-producing glands. Hormones prevent breast milk from being released before your baby is born. They also prompt milk flow after birth. Once  breastfeeding has begun, thoughts of your baby, as well as his or her sucking or crying, can stimulate the release of milk from your milk-producing glands. Benefits of breastfeeding Research shows that breastfeeding offers many health benefits for infants and mothers. It also offers a cost-free and convenient way to feed your baby. For your baby  Your first milk (colostrum) helps your baby's digestive system to function better.  Special cells in your milk (antibodies) help your baby to fight off infections.  Breastfed babies are less likely to develop asthma, allergies, obesity, or type 2 diabetes. They are also at lower risk for sudden infant death syndrome (SIDS).  Nutrients in breast milk are better able to meet your baby's needs compared to infant formula.  Breast milk improves your baby's brain development. For you  Breastfeeding helps to create a very special bond between you and your baby.  Breastfeeding is convenient. Breast milk costs nothing and is always available at the correct temperature.  Breastfeeding helps to burn calories. It helps you to lose the weight that you gained during pregnancy.  Breastfeeding  makes your uterus return faster to its size before pregnancy. It also slows bleeding (lochia) after you give birth.  Breastfeeding helps to lower your risk of developing type 2 diabetes, osteoporosis, rheumatoid arthritis, cardiovascular disease, and breast, ovarian, uterine, and endometrial cancer later in life. Breastfeeding basics Starting breastfeeding  Find a comfortable place to sit or lie down, with your neck and back well-supported.  Place a pillow or a rolled-up blanket under your baby to bring him or her to the level of your breast (if you are seated). Nursing pillows are specially designed to help support your arms and your baby while you breastfeed.  Make sure that your baby's tummy (abdomen) is facing your abdomen.  Gently massage your breast. With your  fingertips, massage from the outer edges of your breast inward toward the nipple. This encourages milk flow. If your milk flows slowly, you may need to continue this action during the feeding.  Support your breast with 4 fingers underneath and your thumb above your nipple (make the letter "C" with your hand). Make sure your fingers are well away from your nipple and your baby's mouth.  Stroke your baby's lips gently with your finger or nipple.  When your baby's mouth is open wide enough, quickly bring your baby to your breast, placing your entire nipple and as much of the areola as possible into your baby's mouth. The areola is the colored area around your nipple. ? More areola should be visible above your baby's upper lip than below the lower lip. ? Your baby's lips should be opened and extended outward (flanged) to ensure an adequate, comfortable latch. ? Your baby's tongue should be between his or her lower gum and your breast.  Make sure that your baby's mouth is correctly positioned around your nipple (latched). Your baby's lips should create a seal on your breast and be turned out (everted).  It is common for your baby to suck about 2-3 minutes in order to start the flow of breast milk. Latching Teaching your baby how to latch onto your breast properly is very important. An improper latch can cause nipple pain, decreased milk supply, and poor weight gain in your baby. Also, if your baby is not latched onto your nipple properly, he or she may swallow some air during feeding. This can make your baby fussy. Burping your baby when you switch breasts during the feeding can help to get rid of the air. However, teaching your baby to latch on properly is still the best way to prevent fussiness from swallowing air while breastfeeding. Signs that your baby has successfully latched onto your nipple  Silent tugging or silent sucking, without causing you pain. Infant's lips should be extended outward  (flanged).  Swallowing heard between every 3-4 sucks once your milk has started to flow (after your let-down milk reflex occurs).  Muscle movement above and in front of his or her ears while sucking. Signs that your baby has not successfully latched onto your nipple  Sucking sounds or smacking sounds from your baby while breastfeeding.  Nipple pain. If you think your baby has not latched on correctly, slip your finger into the corner of your baby's mouth to break the suction and place it between your baby's gums. Attempt to start breastfeeding again. Signs of successful breastfeeding Signs from your baby  Your baby will gradually decrease the number of sucks or will completely stop sucking.  Your baby will fall asleep.  Your baby's body will relax.  Your baby will retain a small amount of milk in his or her mouth.  Your baby will let go of your breast by himself or herself. Signs from you  Breasts that have increased in firmness, weight, and size 1-3 hours after feeding.  Breasts that are softer immediately after breastfeeding.  Increased milk volume, as well as a change in milk consistency and color by the fifth day of breastfeeding.  Nipples that are not sore, cracked, or bleeding. Signs that your baby is getting enough milk  Wetting at least 1-2 diapers during the first 24 hours after birth.  Wetting at least 5-6 diapers every 24 hours for the first week after birth. The urine should be clear or pale yellow by the age of 5 days.  Wetting 6-8 diapers every 24 hours as your baby continues to grow and develop.  At least 3 stools in a 24-hour period by the age of 5 days. The stool should be soft and yellow.  At least 3 stools in a 24-hour period by the age of 7 days. The stool should be seedy and yellow.  No loss of weight greater than 10% of birth weight during the first 3 days of life.  Average weight gain of 4-7 oz (113-198 g) per week after the age of 4  days.  Consistent daily weight gain by the age of 5 days, without weight loss after the age of 2 weeks. After a feeding, your baby may spit up a small amount of milk. This is normal. Breastfeeding frequency and duration Frequent feeding will help you make more milk and can prevent sore nipples and extremely full breasts (breast engorgement). Breastfeed when you feel the need to reduce the fullness of your breasts or when your baby shows signs of hunger. This is called "breastfeeding on demand." Signs that your baby is hungry include:  Increased alertness, activity, or restlessness.  Movement of the head from side to side.  Opening of the mouth when the corner of the mouth or cheek is stroked (rooting).  Increased sucking sounds, smacking lips, cooing, sighing, or squeaking.  Hand-to-mouth movements and sucking on fingers or hands.  Fussing or crying. Avoid introducing a pacifier to your baby in the first 4-6 weeks after your baby is born. After this time, you may choose to use a pacifier. Research has shown that pacifier use during the first year of a baby's life decreases the risk of sudden infant death syndrome (SIDS). Allow your baby to feed on each breast as long as he or she wants. When your baby unlatches or falls asleep while feeding from the first breast, offer the second breast. Because newborns are often sleepy in the first few weeks of life, you may need to awaken your baby to get him or her to feed. Breastfeeding times will vary from baby to baby. However, the following rules can serve as a guide to help you make sure that your baby is properly fed:  Newborns (babies 64 weeks of age or younger) may breastfeed every 1-3 hours.  Newborns should not go without breastfeeding for longer than 3 hours during the day or 5 hours during the night.  You should breastfeed your baby a minimum of 8 times in a 24-hour period. Breast milk pumping Pumping and storing breast milk allows you to  make sure that your baby is exclusively fed your breast milk, even at times when you are unable to breastfeed. This is especially important if you go back to  work while you are still breastfeeding, or if you are not able to be present during feedings. Your lactation consultant can help you find a method of pumping that works best for you and give you guidelines about how long it is safe to store breast milk.      Caring for your breasts while you breastfeed Nipples can become dry, cracked, and sore while breastfeeding. The following recommendations can help keep your breasts moisturized and healthy:  Avoid using soap on your nipples.  Wear a supportive bra designed especially for nursing. Avoid wearing underwire-style bras or extremely tight bras (sports bras).  Air-dry your nipples for 3-4 minutes after each feeding.  Use only cotton bra pads to absorb leaked breast milk. Leaking of breast milk between feedings is normal.  Use lanolin on your nipples after breastfeeding. Lanolin helps to maintain your skin's normal moisture barrier. Pure lanolin is not harmful (not toxic) to your baby. You may also hand express a few drops of breast milk and gently massage that milk into your nipples and allow the milk to air-dry. In the first few weeks after giving birth, some women experience breast engorgement. Engorgement can make your breasts feel heavy, warm, and tender to the touch. Engorgement peaks within 3-5 days after you give birth. The following recommendations can help to ease engorgement:  Completely empty your breasts while breastfeeding or pumping. You may want to start by applying warm, moist heat (in the shower or with warm, water-soaked hand towels) just before feeding or pumping. This increases circulation and helps the milk flow. If your baby does not completely empty your breasts while breastfeeding, pump any extra milk after he or she is finished.  Apply ice packs to your breasts  immediately after breastfeeding or pumping, unless this is too uncomfortable for you. To do this: ? Put ice in a plastic bag. ? Place a towel between your skin and the bag. ? Leave the ice on for 20 minutes, 2-3 times a day.  Make sure that your baby is latched on and positioned properly while breastfeeding. If engorgement persists after 48 hours of following these recommendations, contact your health care provider or a Science writer. Overall health care recommendations while breastfeeding  Eat 3 healthy meals and 3 snacks every day. Well-nourished mothers who are breastfeeding need an additional 450-500 calories a day. You can meet this requirement by increasing the amount of a balanced diet that you eat.  Drink enough water to keep your urine pale yellow or clear.  Rest often, relax, and continue to take your prenatal vitamins to prevent fatigue, stress, and low vitamin and mineral levels in your body (nutrient deficiencies).  Do not use any products that contain nicotine or tobacco, such as cigarettes and e-cigarettes. Your baby may be harmed by chemicals from cigarettes that pass into breast milk and exposure to secondhand smoke. If you need help quitting, ask your health care provider.  Avoid alcohol.  Do not use illegal drugs or marijuana.  Talk with your health care provider before taking any medicines. These include over-the-counter and prescription medicines as well as vitamins and herbal supplements. Some medicines that may be harmful to your baby can pass through breast milk.  It is possible to become pregnant while breastfeeding. If birth control is desired, ask your health care provider about options that will be safe while breastfeeding your baby. Where to find more information: [employer]: www.llli.org Contact a health care provider if:  You feel  like you want to stop breastfeeding or have become frustrated with breastfeeding.  Your nipples are  cracked or bleeding.  Your breasts are red, tender, or warm.  You have: ? Painful breasts or nipples. ? A swollen area on either breast. ? A fever or chills. ? Nausea or vomiting. ? Drainage other than breast milk from your nipples.  Your breasts do not become full before feedings by the fifth day after you give birth.  You feel sad and depressed.  Your baby is: ? Too sleepy to eat well. ? Having trouble sleeping. ? More than 48 week old and wetting fewer than 6 diapers in a 24-hour period. ? Not gaining weight by 89 days of age.  Your baby has fewer than 3 stools in a 24-hour period.  Your baby's skin or the white parts of his or her eyes become yellow. Get help right away if:  Your baby is overly tired (lethargic) and does not want to wake up and feed.  Your baby develops an unexplained fever. Summary  Breastfeeding offers many health benefits for infant and mothers.  Try to breastfeed your infant when he or she shows early signs of hunger.  Gently tickle or stroke your baby's lips with your finger or nipple to allow the baby to open his or her mouth. Bring the baby to your breast. Make sure that much of the areola is in your baby's mouth. Offer one side and burp the baby before you offer the other side.  Talk with your health care provider or lactation consultant if you have questions or you face problems as you breastfeed. This information is not intended to replace advice given to you by your health care provider. Make sure you discuss any questions you have with your health care provider. Document Revised: 01/29/2018 Document Reviewed: 12/06/2016 Elsevier Patient Education  2021 [place].   Tdap (Tetanus, Diphtheria, Pertussis) Vaccine: What You Need to Know 1. Why get vaccinated? Tdap vaccine can prevent tetanus, diphtheria, and pertussis. Diphtheria and pertussis spread from person to person. Tetanus enters the body through cuts or wounds.  TETANUS (T)  causes painful stiffening of the muscles. Tetanus can lead to serious health problems, including being unable to open the mouth, having trouble swallowing and breathing, or death.  DIPHTHERIA (D) can lead to difficulty breathing, heart failure, paralysis, or death.  PERTUSSIS (aP), also known as "whooping cough," can cause uncontrollable, violent coughing that makes it hard to breathe, eat, or drink. Pertussis can be extremely serious especially in babies and young children, causing pneumonia, convulsions, brain damage, or death. In teens and adults, it can cause weight loss, loss of bladder control, passing out, and rib fractures from severe coughing. 2. Tdap vaccine Tdap is only for children 7 years and older, adolescents, and adults.  Adolescents should receive a single dose of Tdap, preferably at age 62 or 27 years. Pregnant people should get a dose of Tdap during every pregnancy, preferably during the early part of the third trimester, to help protect the newborn from pertussis. Infants are most at risk for severe, life-threatening complications from pertussis. Adults who have never received Tdap should get a dose of Tdap. Also, adults should receive a booster dose of either Tdap or Td (a different vaccine that protects against tetanus and diphtheria but not pertussis) every 10 years, or after 5 years in the case of a severe or dirty wound or burn. Tdap may be given at the same time as other vaccines.  3. Talk with your health care provider Tell your vaccine provider if the person getting the vaccine:  Has had an allergic reaction after a previous dose of any vaccine that protects against tetanus, diphtheria, or pertussis, or has any severe, life-threatening allergies  Has had a coma, decreased level of consciousness, or prolonged seizures within 7 days after a previous dose of any pertussis vaccine (DTP, DTaP, or Tdap)  Has seizures or another nervous system problem  Has ever had  Guillain-Barr Syndrome (also called "GBS")  Has had severe pain or swelling after a previous dose of any vaccine that protects against tetanus or diphtheria In some cases, your health care provider may decide to postpone Tdap vaccination until a future visit. People with minor illnesses, such as a cold, may be vaccinated. People who are moderately or severely ill should usually wait until they recover before getting Tdap vaccine.  Your health care provider can give you more information. 4. Risks of a vaccine reaction  Pain, redness, or swelling where the shot was given, mild fever, headache, feeling tired, and nausea, vomiting, diarrhea, or stomachache sometimes happen after Tdap vaccination. People sometimes faint after medical procedures, including vaccination. Tell your provider if you feel dizzy or have vision changes or ringing in the ears.  As with any medicine, there is a very remote chance of a vaccine causing a severe allergic reaction, other serious injury, or death. 5. What if there is a serious problem? An allergic reaction could occur after the vaccinated person leaves the clinic. If you see signs of a severe allergic reaction (hives, swelling of the face and throat, difficulty breathing, a fast heartbeat, dizziness, or weakness), call 9-1-1 and get the person to the nearest hospital. For other signs that concern you, call your health care provider.  Adverse reactions should be reported to the Vaccine Adverse Event Reporting System (VAERS). Your health care provider will usually file this report, or you can do it yourself. Visit the VAERS website at www.vaers.SamedayNews.es or call (931)353-0913. VAERS is only for reporting reactions, and VAERS staff members do not give medical advice. 6. The National Vaccine Injury Compensation Program The [employer] Vaccine Injury Compensation Program (VICP) is a federal program that was created to compensate people who may have been injured by certain  vaccines. Claims regarding alleged injury or death due to vaccination have a time limit for filing, which may be as short as two years. Visit the VICP website at GoldCloset.com.ee or call (646)109-1453 to learn about the program and about filing a claim. 7. How can I learn more?  Ask your health care provider.  Call your local or state health department.  Visit the website of the Food and Drug Administration (FDA) for vaccine package inserts and additional information at TraderRating.uy.  Contact the Centers for Disease Control and Prevention (CDC): ? Call 720-141-2747 (1-800-CDC-INFO) or ? Visit CDC's website at http://hunter.com/. Vaccine Information Statement Tdap (Tetanus, Diphtheria, Pertussis) Vaccine (06/23/2020) This information is not intended to replace advice given to you by your health care provider. Make sure you discuss any questions you have with your health care provider. Document Revised: 07/19/2020 Document Reviewed: 07/19/2020 Elsevier Patient Education  2021 [employer].

## 2021-02-26 NOTE — Progress Notes (Signed)
error 

## 2021-02-26 NOTE — Progress Notes (Signed)
OB-Pt present for routine prenatal care, 28 week labs, BTC signed and tdap administered. Pt stated that she was doing well.

## 2021-02-27 ENCOUNTER — Other Ambulatory Visit: Payer: Medicaid Other

## 2021-02-27 ENCOUNTER — Encounter: Payer: Self-pay | Admitting: Obstetrics and Gynecology

## 2021-02-27 ENCOUNTER — Other Ambulatory Visit: Payer: Self-pay

## 2021-02-27 ENCOUNTER — Ambulatory Visit (INDEPENDENT_AMBULATORY_CARE_PROVIDER_SITE_OTHER): Payer: BLUE CROSS/BLUE SHIELD | Admitting: Obstetrics and Gynecology

## 2021-02-27 VITALS — BP 121/85 | HR 97 | Wt 118.9 lb

## 2021-02-27 DIAGNOSIS — Z3A29 29 weeks gestation of pregnancy: Secondary | ICD-10-CM | POA: Diagnosis not present

## 2021-02-27 DIAGNOSIS — Z23 Encounter for immunization: Secondary | ICD-10-CM

## 2021-02-27 DIAGNOSIS — O358XX0 Maternal care for other (suspected) fetal abnormality and damage, not applicable or unspecified: Secondary | ICD-10-CM | POA: Diagnosis not present

## 2021-02-27 DIAGNOSIS — O35BXX0 Maternal care for other (suspected) fetal abnormality and damage, fetal cardiac anomalies, not applicable or unspecified: Secondary | ICD-10-CM

## 2021-02-27 DIAGNOSIS — Z3483 Encounter for supervision of other normal pregnancy, third trimester: Secondary | ICD-10-CM

## 2021-02-27 LAB — POCT URINALYSIS DIPSTICK OB
Bilirubin, UA: NEGATIVE
Blood, UA: NEGATIVE
Clarity, UA: NEGATIVE
Glucose, UA: NEGATIVE
Ketones, UA: NEGATIVE
Leukocytes, UA: NEGATIVE
Nitrite, UA: NEGATIVE
POC,PROTEIN,UA: NEGATIVE
Spec Grav, UA: 1.015 (ref 1.010–1.025)
Urobilinogen, UA: 0.2 E.U./dL
pH, UA: 7.5 (ref 5.0–8.0)

## 2021-02-27 MED ORDER — TETANUS-DIPHTH-ACELL PERTUSSIS 5-2.5-18.5 LF-MCG/0.5 IM SUSY
0.5000 mL | PREFILLED_SYRINGE | Freq: Once | INTRAMUSCULAR | Status: AC
  Administered 2021-02-27: 0.5 mL via INTRAMUSCULAR

## 2021-02-27 NOTE — Progress Notes (Signed)
ROB: Doing well, no major complaints.  For 28 week labs today.  Desires to breastfeed.  Desires Depo Provera for contraception. For Tdap today, signed blood consent.  For f/u ultrasound this week for EIF noted on anatomy scan. RTC in 2 weeks.    The following were addressed during this visit:  Breastfeeding Education - Nonpharmacological pain relief methods for labor    Comments: Deep breathing, focusing on pleasant things, movement and walking, heating pads or cold compress, massage and relaxation, continuous support from someone you trust, and Doulas   - The importance of early skin-to-skin contact    Comments: Keeps baby warm and secure, helps keep baby's blood sugar up and breathing steady, easier to bond and breastfeed, and helps calm baby.  - Rooming-in on a 24-hour basis    Comments: Easier to learn baby's feeding cues, easier to bond and get to know each other, and encourages milk production.

## 2021-02-28 LAB — CBC
Hematocrit: 35.2 % (ref 34.0–46.6)
Hemoglobin: 11.8 g/dL (ref 11.1–15.9)
MCH: 29.9 pg (ref 26.6–33.0)
MCHC: 33.5 g/dL (ref 31.5–35.7)
MCV: 89 fL (ref 79–97)
Platelets: 116 10*3/uL — ABNORMAL LOW (ref 150–450)
RBC: 3.94 x10E6/uL (ref 3.77–5.28)
RDW: 12.4 % (ref 11.7–15.4)
WBC: 11.1 10*3/uL — ABNORMAL HIGH (ref 3.4–10.8)

## 2021-02-28 LAB — RPR: RPR Ser Ql: NONREACTIVE

## 2021-02-28 LAB — GLUCOSE, 1 HOUR GESTATIONAL: Gestational Diabetes Screen: 119 mg/dL (ref 65–139)

## 2021-03-01 ENCOUNTER — Ambulatory Visit: Payer: Medicaid Other | Attending: Obstetrics

## 2021-03-01 ENCOUNTER — Ambulatory Visit (HOSPITAL_BASED_OUTPATIENT_CLINIC_OR_DEPARTMENT_OTHER): Payer: Medicaid Other

## 2021-03-01 ENCOUNTER — Other Ambulatory Visit: Payer: Self-pay

## 2021-03-01 DIAGNOSIS — O358XX0 Maternal care for other (suspected) fetal abnormality and damage, not applicable or unspecified: Secondary | ICD-10-CM | POA: Diagnosis not present

## 2021-03-01 DIAGNOSIS — Z3A30 30 weeks gestation of pregnancy: Secondary | ICD-10-CM

## 2021-03-01 DIAGNOSIS — Z820 Family history of epilepsy and other diseases of the nervous system: Secondary | ICD-10-CM

## 2021-03-01 DIAGNOSIS — O352XX0 Maternal care for (suspected) hereditary disease in fetus, not applicable or unspecified: Secondary | ICD-10-CM | POA: Diagnosis not present

## 2021-03-01 DIAGNOSIS — Z3482 Encounter for supervision of other normal pregnancy, second trimester: Secondary | ICD-10-CM

## 2021-03-01 DIAGNOSIS — O283 Abnormal ultrasonic finding on antenatal screening of mother: Secondary | ICD-10-CM | POA: Diagnosis not present

## 2021-03-01 NOTE — Progress Notes (Unsigned)
MFM Consult Note  This patient was seen for a detailed fetal anatomy scan as an echogenic focus was noted on her fetal anatomy scan performed in your office.  The patient reports a family history of Huntington's disease.  She denies any other significant past medical history and denies any problems in her current pregnancy.    She had a cell free DNA test earlier in her pregnancy which indicated a low risk for trisomy 53, 41, and 13. A female fetus is predicted.   She was informed that the fetal growth and amniotic fluid level were appropriate for her gestational age.  Although limited due to her advanced gestational age, other than the echogenic focus in the left ventricle of the fetal heart, there were no other obvious fetal anomalies noted today.   The small association between an echogenic focus and Down syndrome was discussed. Due to the echogenic focus noted today, the patient was offered and declined an amniocentesis today for definitive diagnosis of fetal aneuploidy.  She reports that she is comfortable with her negative cell free DNA test.  The patient was informed that anomalies may be missed due to technical limitations. If the fetus is in a suboptimal position or maternal habitus is increased, visualization of the fetus in the maternal uterus may be impaired.  A possible anterior succenturiate lobe of the placenta was noted on today's exam.  The main posterior lobe of the placenta appears within normal limits.  The patient was reassured that her baby will most likely not be affected by this finding.  Care should be given to examining the placenta after birth to ensure that all lobes of the placenta have been delivered.  There were no signs of vasa previa noted today.   The patient had a consultation with our genetic counselor regarding the family history of Huntington's disease.  Please refer to the genetic counselor's note regarding what was discussed.  Due to the succenturiate lobe of  the placenta noted today, a follow-up exam was scheduled in 5 weeks.  A total of 40 minutes was spent counseling and coordinating the care for this patient.  Greater than 50% of the time was spent in direct face-to-face contact.

## 2021-03-01 NOTE — Progress Notes (Signed)
Referring Provider:  [facility] 40 minute consultation  Ms. Manzo was referred to [facility] for and ultrasound and genetic counseling was made available due to the family history of Huntington disease (HD).  This note summarizes the information we discussed with Ms. Garfield and her partner, Onalee Hua.    We first obtained a detailed family history and pregnancy history. The pregnancy history is unremarkable for exposures or complications which are expected to increase the risk for birth defects. The patient is currently [redacted] weeks pregnant.  She has a healthy 97 year old daughter.  The father of this pregnancy has no prior children.  The family history is remarkable for Huntington disease in her mother's family.  There are no other genetic conditions, birth defects or developmental differences reported in either family.  Specifically, the patient reported that her maternal grandfather, maternal aunt and maternal uncle all passed away due to HD.  She is not sure of the age of her grandfather or the age of onset for symptoms in any of the relatives, but recalls that her aunt and uncle were in their 57s-50s when they passed away.  Her aunt had no children but her uncle has two sons who are reported to have had positive results on genetic testing for the condition.  The patient's mother is now 12 years old and is reported to have no symptoms of the condition.  Ms. Haupt is not sure if her mother had genetic testing at any time, but will speak with her about that.    We discussed the dominant inheritance and features of HD.  Dominant means that there is a change in one of the two copies of the gene for this condition which results in features of the disease.  With HD, if a parent has the gene change and is affected with this condition, then each of their children has a 50% chance for inheriting that gene and eventually developing the condition as well.  The age of onset is variable among different  family members, and tables are available to help calculate the chance of developing the disease based upon the age of a person who is at risk.  For this family, if Ms. Brotzman's mother is 69 years old and has no symptoms, the chance that she has the genetic change and will still develop symptoms is estimated to be 12% rather than 50%.  Another data set can be used to determine the chance for Ms. Percle to carry the HD gene based upon her mother's lack of symptoms at age 43 combined with the patient's lack of symptoms at age 51.  This residual risk estimate is 4.7% for our patient.  If she were to have the gene change, then there would be a 50% chance to pass it on to each of her children (or 2.35% risk for this pregnancy).  Genetic testing is available to determine if a person has the change in the gene that is responsible for HD.  This type of change in the DNA is referred to as a trinucleotide repeat, which is a series of repeated DNA segments called a CAG repeat.  Copy numbers of 27 or less are normal.  Copy numbers in the 27 to 35 range are intermediate (which are not expected to result in disease but may expand in the next generation), and copy numbers greater than 36 are consistent with expected development of the disease.  Age of onset correlates approximately with number of repeats. Onset of  symptoms for HD is typically from 37-45 years old, with survival an estimated 15-18 years after symptoms develop.  Clinical features include neurological impairment, psychological changes, involuntary eye and body movements, coordination problems and difficulty in planning.  Because this trinucleotide repeat copy number may increase from generation to generation, there are some families who show anticipation, or earlier age of onset of symptoms in successive generations.  Though testing an individual prior to the development of HD symptoms is possible, it requires significant counseling and consideration before  testing should be performed.  Strict protocols are in place to ensure appropriate pre- and post-test counseling as well as careful evaluation for features of HD.  If a patient is found to have this gene change and would change the course of the pregnancy based upon the genetic test results, prenatal diagnosis through amniocentesis or CV[facility] is also possible.    Today, Ms. Raisch indicated that she is not interested in considering testing for HD at this time because she feels her chance to have the HD gene is low.  She planned to speak with her mother and find out what testing has been done and call me with any questions.  If she desires additional information or is considering testing, we are happy to facilitate the appointment as needed.  The patient was encouraged to call with questions or concerns.  We can be contacted at [phone].   Cherly Anderson, MS, CGC

## 2021-03-08 ENCOUNTER — Telehealth: Payer: Self-pay

## 2021-03-08 NOTE — Patient Outreach (Signed)
Care Coordination  03/08/2021  [name] 10/03/1986 [id number]   Medicaid Managed Care   Unsuccessful Outreach Note  03/08/2021 Name: Kathleen Romero MRN: [id number] DOB: Sep 23, 1986  Referred by: Hildred Laser, MD Reason for referral : High Risk Managed Medicaid (MM Screen Unsuccessful Telephone Outreach)   An unsuccessful telephone outreach was attempted today. The patient was referred to the case management team for assistance with care management and care coordination.   Follow Up Plan: The care management team will reach out to the patient again over the next 7 days.   Gus Puma, BSW, [place] [facility]  [place]  High Risk Managed Medicaid Team

## 2021-03-08 NOTE — Patient Instructions (Signed)
Visit Information  Ms. Orene Norem  - as a part of your Medicaid benefit, you are eligible for care management and care coordination services at no cost or copay. I was unable to reach you by phone today but would be happy to help you with your health related needs. Please feel free to call me @ [phone].   A member of the Managed Medicaid care management team will reach out to you again over the next 7 days.   Davien Malone, BSW, [facility]  [place]  High Risk Managed Medicaid Team    

## 2021-03-14 ENCOUNTER — Other Ambulatory Visit: Payer: Self-pay

## 2021-03-14 ENCOUNTER — Ambulatory Visit (INDEPENDENT_AMBULATORY_CARE_PROVIDER_SITE_OTHER): Payer: Self-pay | Admitting: Obstetrics and Gynecology

## 2021-03-14 ENCOUNTER — Encounter: Payer: Self-pay | Admitting: Obstetrics and Gynecology

## 2021-03-14 VITALS — BP 133/72 | HR 102 | Wt 121.0 lb

## 2021-03-14 DIAGNOSIS — Z3A32 32 weeks gestation of pregnancy: Secondary | ICD-10-CM

## 2021-03-14 DIAGNOSIS — Z3483 Encounter for supervision of other normal pregnancy, third trimester: Secondary | ICD-10-CM

## 2021-03-14 LAB — POCT URINALYSIS DIPSTICK OB
Bilirubin, UA: NEGATIVE
Blood, UA: NEGATIVE
Glucose, UA: NEGATIVE
Ketones, UA: NEGATIVE
Leukocytes, UA: NEGATIVE
Nitrite, UA: NEGATIVE
POC,PROTEIN,UA: NEGATIVE
Spec Grav, UA: 1.01 (ref 1.010–1.025)
Urobilinogen, UA: 0.2 E.U./dL
pH, UA: 7 (ref 5.0–8.0)

## 2021-03-14 NOTE — Progress Notes (Signed)
ROB: 28-week labs reviewed.  MFM consult reviewed.  Patient says she is doing well.  Daily fetal movement.  Thrombocytopenia discussed.  Plan follow-up later in pregnancy.

## 2021-03-14 NOTE — Patient Instructions (Signed)

## 2021-03-18 DIAGNOSIS — Z419 Encounter for procedure for purposes other than remedying health state, unspecified: Secondary | ICD-10-CM | POA: Diagnosis not present

## 2021-03-19 ENCOUNTER — Telehealth: Payer: Self-pay

## 2021-03-19 NOTE — Patient Instructions (Signed)
Visit Information  Ms. Betsabe Iglesia  - as a part of your Medicaid benefit, you are eligible for care management and care coordination services at no cost or copay. I was unable to reach you by phone today but would be happy to help you with your health related needs. Please feel free to call me @ [phone].   A member of the Managed Medicaid care management team will reach out to you again over the next 7 days.   Gus Puma, BSW, [place] [facility]  [place]  High Risk Managed Medicaid Team

## 2021-03-19 NOTE — Patient Outreach (Signed)
Care Coordination  03/19/2021  [name] 1986-03-07 [id number]   Medicaid Managed Care   Unsuccessful Outreach Note  03/19/2021 Name: Kathleen Romero MRN: [id number] DOB: 1986/04/27  Referred by: Hildred Laser, MD Reason for referral : High Risk Managed Medicaid (MM Screen Telephone unsuccessful Telephone outreach)   A second unsuccessful telephone outreach was attempted today. The patient was referred to the case management team for assistance with care management and care coordination.   Follow Up Plan: The care management team will reach out to the patient again over the next 7 days.   Gus Puma, BSW, [place] [facility]  [place]  High Risk Managed Medicaid Team

## 2021-03-21 ENCOUNTER — Telehealth: Payer: Self-pay | Admitting: Certified Nurse Midwife

## 2021-03-21 NOTE — Telephone Encounter (Signed)
New Message:   Pt called and has been cramping since yesterday.  She states that it was so bad that she had to leave work. It was continued through the night and morning.  Pt denies any spotting

## 2021-03-21 NOTE — Telephone Encounter (Signed)
Spoke to pt. She stated that she was doing better today since she has not been on her feet all day. Pt stated that she is on her feet for 7 hours at work she has 10 minute breaks every 4 hours with a 30 minute lunch.

## 2021-03-27 ENCOUNTER — Encounter: Payer: Self-pay | Admitting: Obstetrics and Gynecology

## 2021-03-27 ENCOUNTER — Other Ambulatory Visit: Payer: Self-pay

## 2021-03-27 ENCOUNTER — Ambulatory Visit (INDEPENDENT_AMBULATORY_CARE_PROVIDER_SITE_OTHER): Payer: Medicaid Other | Admitting: Obstetrics and Gynecology

## 2021-03-27 VITALS — BP 116/67 | HR 93 | Wt 118.9 lb

## 2021-03-27 DIAGNOSIS — D696 Thrombocytopenia, unspecified: Secondary | ICD-10-CM | POA: Insufficient documentation

## 2021-03-27 DIAGNOSIS — O99113 Other diseases of the blood and blood-forming organs and certain disorders involving the immune mechanism complicating pregnancy, third trimester: Secondary | ICD-10-CM | POA: Insufficient documentation

## 2021-03-27 DIAGNOSIS — Z3483 Encounter for supervision of other normal pregnancy, third trimester: Secondary | ICD-10-CM

## 2021-03-27 DIAGNOSIS — Z3A33 33 weeks gestation of pregnancy: Secondary | ICD-10-CM

## 2021-03-27 LAB — POCT URINALYSIS DIPSTICK OB
Bilirubin, UA: NEGATIVE
Blood, UA: NEGATIVE
Glucose, UA: NEGATIVE
Ketones, UA: NEGATIVE
Nitrite, UA: NEGATIVE
Spec Grav, UA: 1.025 (ref 1.010–1.025)
Urobilinogen, UA: 0.2 E.U./dL
pH, UA: 6 (ref 5.0–8.0)

## 2021-03-27 NOTE — Progress Notes (Signed)
OB-Pt present for routine prenatal care. Pt c/o braxton hick contractions and abd pain.

## 2021-03-27 NOTE — Patient Instructions (Signed)

## 2021-03-27 NOTE — Progress Notes (Signed)
ROB: Patient notes feeling kind of "blah" over the past few days. Also notes her daughter has been feeling the same. Denies cough, fevers, chills, abdominal pain, or nausea/vomiting. Desires circumcision for female infant. To recheck platelets next visit for thrombocytopenia.  RTC in 2 weeks.

## 2021-03-28 ENCOUNTER — Telehealth: Payer: Self-pay

## 2021-03-28 NOTE — Patient Instructions (Signed)
Visit Information  Ms. Angee Gupton  - as a part of your Medicaid benefit, you are eligible for care management and care coordination services at no cost or copay. I was unable to reach you by phone today but would be happy to help you with your health related needs. Please feel free to call me @ [phone].    Gus Puma, BSW, [place] [facility]  [employer] Risk Managed Medicaid Team  [phone]

## 2021-03-28 NOTE — Patient Outreach (Signed)
Care Coordination  03/28/2021  [name] [birth year] [id number]   Medicaid Managed Care   Unsuccessful Outreach Note  03/28/2021 Name: Kathleen Romero MRN: [id number] DOB: 1985-12-09  Referred by: Hildred Laser, MD Reason for referral : High Risk Managed Medicaid (MM [facility] Screener Unsuccessful telephone Outreach)   Third unsuccessful telephone outreach was attempted today. The patient was referred to the case management team for assistance with care management and care coordination. The patient's primary care provider has been notified of our unsuccessful attempts to make or maintain contact with the patient. The care management team is pleased to engage with this patient at any time in the future should he/she be interested in assistance from the care management team.   Follow Up Plan: The patient has been provided with contact information for the care management team and has been advised to call with any health related questions or concerns.   Gus Puma, BSW, [place] [facility]  [employer] Risk Managed Medicaid Team  [phone]

## 2021-04-02 ENCOUNTER — Other Ambulatory Visit: Payer: Self-pay | Admitting: Obstetrics and Gynecology

## 2021-04-02 DIAGNOSIS — Z820 Family history of epilepsy and other diseases of the nervous system: Secondary | ICD-10-CM

## 2021-04-02 DIAGNOSIS — O283 Abnormal ultrasonic finding on antenatal screening of mother: Secondary | ICD-10-CM

## 2021-04-05 ENCOUNTER — Other Ambulatory Visit: Payer: Self-pay | Admitting: Obstetrics and Gynecology

## 2021-04-05 ENCOUNTER — Ambulatory Visit: Payer: Managed Care, Other (non HMO) | Attending: Obstetrics and Gynecology

## 2021-04-05 ENCOUNTER — Other Ambulatory Visit: Payer: Self-pay

## 2021-04-05 DIAGNOSIS — Z3A35 35 weeks gestation of pregnancy: Secondary | ICD-10-CM | POA: Insufficient documentation

## 2021-04-05 DIAGNOSIS — Z820 Family history of epilepsy and other diseases of the nervous system: Secondary | ICD-10-CM | POA: Insufficient documentation

## 2021-04-05 DIAGNOSIS — O283 Abnormal ultrasonic finding on antenatal screening of mother: Secondary | ICD-10-CM

## 2021-04-05 DIAGNOSIS — O352XX0 Maternal care for (suspected) hereditary disease in fetus, not applicable or unspecified: Secondary | ICD-10-CM

## 2021-04-05 DIAGNOSIS — O358XX0 Maternal care for other (suspected) fetal abnormality and damage, not applicable or unspecified: Secondary | ICD-10-CM | POA: Insufficient documentation

## 2021-04-05 DIAGNOSIS — Z369 Encounter for antenatal screening, unspecified: Secondary | ICD-10-CM | POA: Diagnosis not present

## 2021-04-10 ENCOUNTER — Ambulatory Visit (INDEPENDENT_AMBULATORY_CARE_PROVIDER_SITE_OTHER): Payer: Managed Care, Other (non HMO) | Admitting: Obstetrics and Gynecology

## 2021-04-10 ENCOUNTER — Other Ambulatory Visit: Payer: Medicaid Other

## 2021-04-10 ENCOUNTER — Other Ambulatory Visit: Payer: Self-pay

## 2021-04-10 VITALS — BP 106/70 | HR 82 | Wt 123.9 lb

## 2021-04-10 DIAGNOSIS — Z3483 Encounter for supervision of other normal pregnancy, third trimester: Secondary | ICD-10-CM | POA: Diagnosis not present

## 2021-04-10 DIAGNOSIS — Z3A35 35 weeks gestation of pregnancy: Secondary | ICD-10-CM

## 2021-04-10 LAB — POCT URINALYSIS DIPSTICK OB
Bilirubin, UA: NEGATIVE
Blood, UA: NEGATIVE
Glucose, UA: NEGATIVE
Ketones, UA: NEGATIVE
Nitrite, UA: NEGATIVE
POC,PROTEIN,UA: NEGATIVE
Spec Grav, UA: <=1.005 — AB (ref 1.010–1.025)
Urobilinogen, UA: 0.2 E.U./dL
pH, UA: 7 (ref 5.0–8.0)

## 2021-04-10 NOTE — Progress Notes (Signed)
ROB: Discussed MFM recommendations and findings.  6 percentile discussed.  Possible early delivery discussed.  She has no complaints today.  NST reactive.  Cultures next visit.  BPP next Thursday with MFM as scheduled.

## 2021-04-11 ENCOUNTER — Other Ambulatory Visit: Payer: Self-pay | Admitting: Obstetrics and Gynecology

## 2021-04-11 DIAGNOSIS — O283 Abnormal ultrasonic finding on antenatal screening of mother: Secondary | ICD-10-CM

## 2021-04-11 DIAGNOSIS — O365931 Maternal care for other known or suspected poor fetal growth, third trimester, fetus 1: Secondary | ICD-10-CM

## 2021-04-11 DIAGNOSIS — Z820 Family history of epilepsy and other diseases of the nervous system: Secondary | ICD-10-CM

## 2021-04-17 ENCOUNTER — Other Ambulatory Visit: Payer: Medicaid Other

## 2021-04-17 ENCOUNTER — Ambulatory Visit (INDEPENDENT_AMBULATORY_CARE_PROVIDER_SITE_OTHER): Payer: Managed Care, Other (non HMO) | Admitting: Obstetrics and Gynecology

## 2021-04-17 ENCOUNTER — Other Ambulatory Visit: Payer: Self-pay | Admitting: Obstetrics and Gynecology

## 2021-04-17 ENCOUNTER — Other Ambulatory Visit: Payer: Self-pay

## 2021-04-17 ENCOUNTER — Encounter: Payer: Self-pay | Admitting: Obstetrics and Gynecology

## 2021-04-17 VITALS — BP 117/69 | HR 95 | Wt 123.9 lb

## 2021-04-17 DIAGNOSIS — O0993 Supervision of high risk pregnancy, unspecified, third trimester: Secondary | ICD-10-CM

## 2021-04-17 DIAGNOSIS — Z3A36 36 weeks gestation of pregnancy: Secondary | ICD-10-CM

## 2021-04-17 DIAGNOSIS — D696 Thrombocytopenia, unspecified: Secondary | ICD-10-CM

## 2021-04-17 DIAGNOSIS — Z3483 Encounter for supervision of other normal pregnancy, third trimester: Secondary | ICD-10-CM | POA: Diagnosis not present

## 2021-04-17 DIAGNOSIS — O365930 Maternal care for other known or suspected poor fetal growth, third trimester, not applicable or unspecified: Secondary | ICD-10-CM

## 2021-04-17 DIAGNOSIS — O358XX0 Maternal care for other (suspected) fetal abnormality and damage, not applicable or unspecified: Secondary | ICD-10-CM | POA: Diagnosis not present

## 2021-04-17 DIAGNOSIS — O99113 Other diseases of the blood and blood-forming organs and certain disorders involving the immune mechanism complicating pregnancy, third trimester: Secondary | ICD-10-CM | POA: Diagnosis not present

## 2021-04-17 LAB — POCT URINALYSIS DIPSTICK OB
Bilirubin, UA: NEGATIVE
Blood, UA: NEGATIVE
Glucose, UA: NEGATIVE
Ketones, UA: NEGATIVE
Leukocytes, UA: NEGATIVE
Nitrite, UA: NEGATIVE
Spec Grav, UA: 1.025 (ref 1.010–1.025)
Urobilinogen, UA: 0.2 E.U./dL
pH, UA: 6 (ref 5.0–8.0)

## 2021-04-17 NOTE — Progress Notes (Signed)
ROB: Patient notes some Kathleen Romero.  For NST today for growth restriction (6%ile noted on last scan 5/19, normal Dopplers). For repeat scan with MFM on Thursday. For repeat CBC today to follow mild thrombocytopenia. 36 week cultures done. RTC in 1 week for NST (will need twice weekly) if no recommendations for delivery.  Discussed visitation policy at hospital.    NONSTRESS TEST INTERPRETATION  INDICATIONS: IUGR  FHR baseline: 140 bpm RESULTS:Reactive COMMENTS: Occasional contractions (not detectable to patient)   PLAN: 1. Continue fetal kick counts twice a day. 2. Continue antepartum testing as scheduled-Biweekly

## 2021-04-17 NOTE — Addendum Note (Signed)
Addended by: Fabian November on: 04/17/2021 02:53 PM   Modules accepted: Orders

## 2021-04-17 NOTE — Progress Notes (Signed)
OB-Pt present for routine prenatal care at [redacted]w[redacted]d, 36 week cultures and NST. Pt stated that she was doing well and having braxton hick contractions off and on.

## 2021-04-17 NOTE — Patient Instructions (Signed)
Fetal Growth Restriction  Fetal growth restriction, also known as intrauterine growth restriction (IUGR), is when a baby is not growing normally during pregnancy. A baby with fetal growth restriction is smaller than he or she should be and may weigh less than normal at birth. Fetal growth restriction can result from a problem with the placenta, which is an organ that supplies the unborn baby (fetus) with oxygen and nutrition. Babies with fetal growth restriction are at higher risk for early delivery and may need more care than usual after birth. What are the causes? The most common cause of fetal growth restriction is a problem with the placenta or umbilical cord that causes the fetus to get less oxygen or nutrition than needed. Other causes include:  Poor maternal nutrition and not enough weight gain during pregnancy.  Exposure to chemicals found in substances such as cigarettes, alcohol, and some drugs.  Some prescription medicines.  Other problems that develop in the womb (congenital birth defects).  Genetic disorders.  Infection.  Carrying more than one baby. What increases the risk? This condition is more likely to affect your baby if you:  Are older than age 84 or younger than age 93.  Have medical conditions such as high blood pressure, preeclampsia, diabetes, heart or kidney disease, systemic lupus erythematosus, or anemia.  Live at a very high altitude during pregnancy.  Have a personal history or family history of: ? Fetal growth restriction. ? A genetic disorder.  Have had treatments to help have children (infertility treatments). What are the signs or symptoms? Fetal growth restriction does not cause many symptoms. Your health care provider may suspect this condition if your belly area (fundus) is not as big as expected for the stage of your pregnancy. How is this diagnosed? This condition is diagnosed with physical exams and prenatal exams. You may also  have:  Fundal height measurements to check the size of your uterus. The fundal height is the distance from the pubic bone to the top of the uterus.  An ultrasound done to measure your baby's size compared to the size of other babies at the same stage of development (gestational age). You may also have tests to find the cause of fetal growth restriction. These may include:  Amniocentesis. This is a procedure that involves passing a needle into the uterus to collect a sample of fluid that surrounds the fetus (amniotic fluid). This may be done to check for signs of infection or congenital defects.  Tests to evaluate blood flow to your baby and placenta. How is this treated? In most cases, the goal of treatment is to treat the cause of fetal growth restriction. Your health care providers will monitor your pregnancy closely and help you manage your pregnancy. If your condition is caused by a problem with the placenta and your baby is not getting enough blood, you may need:  Medicine to start labor and deliver your baby early (induction).  Cesarean delivery, also called a C-section. In this procedure, your baby is delivered through an incision in your abdomen and uterus. Follow these instructions at home: Medicines  Take over-the-counter and prescription medicines only as told by your health care provider. This includes vitamins and supplements.  Make sure that your health care provider knows about and approves of all medicines, supplements, vitamins, eye drops, and creams that you use. General instructions  Eat a healthy diet that includes fresh fruits and vegetables, lean proteins, whole grains, and calcium-rich foods such as milk, yogurt, and  dark, leafy greens. Work with your health care provider or a dietitian to make sure that: ? You are getting enough nutrients. ? You are gaining enough weight during your pregnancy.  Rest as needed. Try to get at least 8 hours of sleep every  night.  Do not drink alcohol or use drugs.  Do not use any products that contain nicotine or tobacco. These products include cigarettes, chewing tobacco, and vaping devices, such as e-cigarettes. If you need help quitting, ask your health care provider.  Keep all follow-up visits. This is important. Get help right away if:  You notice that your unborn baby is moving less than usual or is not moving.  You have contractions that are 5 minutes or less apart, or that increase in frequency, intensity, or length.  You have signs and symptoms of infection, including a fever.  You have vaginal bleeding.  You have increased swelling in your legs, hands, or face.  You have vision changes, including seeing spots or having blurry or double vision.  You have a severe headache that does not go away.  You have a sudden, sharp pain in the abdomen or low back pain.  You have an uncontrolled gush or trickle of fluid from your vagina. Summary  Fetal growth restriction is when a baby is not growing normally during pregnancy.  The most common cause of fetal growth restriction is a problem with the placenta or umbilical cord that causes the fetus to get less oxygen or nutrition than needed.  This condition is diagnosed with physical and prenatal exams.  Your health care provider will monitor your baby's growth with ultrasounds throughout pregnancy.  Make sure that your health care provider knows about and approves of all medicines, supplements, vitamins, eye drops, and creams that you use. This information is not intended to replace advice given to you by your health care provider. Make sure you discuss any questions you have with your health care provider. Document Revised: 06/26/2020 Document Reviewed: 06/26/2020 Elsevier Patient Education  2021 [employer].

## 2021-04-18 DIAGNOSIS — Z419 Encounter for procedure for purposes other than remedying health state, unspecified: Secondary | ICD-10-CM | POA: Diagnosis not present

## 2021-04-18 LAB — CBC
Hematocrit: 33.8 % — ABNORMAL LOW (ref 34.0–46.6)
Hemoglobin: 11.2 g/dL (ref 11.1–15.9)
MCH: 29.8 pg (ref 26.6–33.0)
MCHC: 33.1 g/dL (ref 31.5–35.7)
MCV: 90 fL (ref 79–97)
Platelets: 129 10*3/uL — ABNORMAL LOW (ref 150–450)
RBC: 3.76 x10E6/uL — ABNORMAL LOW (ref 3.77–5.28)
RDW: 13.2 % (ref 11.7–15.4)
WBC: 12.7 10*3/uL — ABNORMAL HIGH (ref 3.4–10.8)

## 2021-04-19 ENCOUNTER — Ambulatory Visit: Payer: Managed Care, Other (non HMO) | Attending: Maternal & Fetal Medicine

## 2021-04-19 ENCOUNTER — Other Ambulatory Visit: Payer: Self-pay

## 2021-04-19 DIAGNOSIS — Z820 Family history of epilepsy and other diseases of the nervous system: Secondary | ICD-10-CM | POA: Diagnosis not present

## 2021-04-19 DIAGNOSIS — O365930 Maternal care for other known or suspected poor fetal growth, third trimester, not applicable or unspecified: Secondary | ICD-10-CM | POA: Diagnosis not present

## 2021-04-19 DIAGNOSIS — O283 Abnormal ultrasonic finding on antenatal screening of mother: Secondary | ICD-10-CM | POA: Diagnosis not present

## 2021-04-19 DIAGNOSIS — Z364 Encounter for antenatal screening for fetal growth retardation: Secondary | ICD-10-CM | POA: Diagnosis not present

## 2021-04-19 DIAGNOSIS — Z3A37 37 weeks gestation of pregnancy: Secondary | ICD-10-CM | POA: Insufficient documentation

## 2021-04-19 DIAGNOSIS — O365931 Maternal care for other known or suspected poor fetal growth, third trimester, fetus 1: Secondary | ICD-10-CM

## 2021-04-19 LAB — GC/CHLAMYDIA PROBE AMP
Chlamydia trachomatis, NAA: NEGATIVE
Neisseria Gonorrhoeae by PCR: NEGATIVE

## 2021-04-19 LAB — STREP GP B NAA: Strep Gp B NAA: NEGATIVE

## 2021-04-23 ENCOUNTER — Other Ambulatory Visit: Payer: Self-pay | Admitting: Obstetrics and Gynecology

## 2021-04-23 DIAGNOSIS — O283 Abnormal ultrasonic finding on antenatal screening of mother: Secondary | ICD-10-CM

## 2021-04-23 DIAGNOSIS — O365931 Maternal care for other known or suspected poor fetal growth, third trimester, fetus 1: Secondary | ICD-10-CM

## 2021-04-23 DIAGNOSIS — Z820 Family history of epilepsy and other diseases of the nervous system: Secondary | ICD-10-CM

## 2021-04-25 ENCOUNTER — Encounter: Payer: Self-pay | Admitting: Obstetrics and Gynecology

## 2021-04-25 ENCOUNTER — Other Ambulatory Visit: Payer: Medicaid Other

## 2021-04-25 ENCOUNTER — Other Ambulatory Visit: Payer: Self-pay

## 2021-04-25 ENCOUNTER — Ambulatory Visit (INDEPENDENT_AMBULATORY_CARE_PROVIDER_SITE_OTHER): Payer: Managed Care, Other (non HMO) | Admitting: Obstetrics and Gynecology

## 2021-04-25 VITALS — BP 116/83 | HR 97 | Wt 127.7 lb

## 2021-04-25 DIAGNOSIS — O365930 Maternal care for other known or suspected poor fetal growth, third trimester, not applicable or unspecified: Secondary | ICD-10-CM

## 2021-04-25 DIAGNOSIS — Z3A38 38 weeks gestation of pregnancy: Secondary | ICD-10-CM

## 2021-04-25 DIAGNOSIS — O0993 Supervision of high risk pregnancy, unspecified, third trimester: Secondary | ICD-10-CM

## 2021-04-25 NOTE — Progress Notes (Signed)
ROB and nst.   NONSTRESS TEST INTERPRETATION  INDICATIONS: iugr  FHR baseline:  RESULTS: Reactive COMMENTS:   PLAN: 1. Continue fetal kick counts twice a day. 2. Continue antepartum testing as scheduled-Biweekly 3.  Darol Destine, CMA

## 2021-04-25 NOTE — Progress Notes (Signed)
ROB: Denies contractions.  No complaints.  Reactive NST today.  Has follow-up growth with MFM tomorrow.  We will base delivery timing on follow-up growth.  Recommend kick counts-patient instructed.

## 2021-04-26 ENCOUNTER — Other Ambulatory Visit: Payer: Self-pay

## 2021-04-26 ENCOUNTER — Ambulatory Visit: Payer: Managed Care, Other (non HMO) | Attending: Maternal & Fetal Medicine

## 2021-04-26 DIAGNOSIS — O365930 Maternal care for other known or suspected poor fetal growth, third trimester, not applicable or unspecified: Secondary | ICD-10-CM | POA: Insufficient documentation

## 2021-04-26 DIAGNOSIS — O358XX0 Maternal care for other (suspected) fetal abnormality and damage, not applicable or unspecified: Secondary | ICD-10-CM | POA: Insufficient documentation

## 2021-04-26 DIAGNOSIS — O365931 Maternal care for other known or suspected poor fetal growth, third trimester, fetus 1: Secondary | ICD-10-CM

## 2021-04-26 DIAGNOSIS — Z3A38 38 weeks gestation of pregnancy: Secondary | ICD-10-CM | POA: Insufficient documentation

## 2021-04-26 DIAGNOSIS — Z820 Family history of epilepsy and other diseases of the nervous system: Secondary | ICD-10-CM

## 2021-04-26 DIAGNOSIS — O283 Abnormal ultrasonic finding on antenatal screening of mother: Secondary | ICD-10-CM

## 2021-05-01 ENCOUNTER — Encounter: Payer: Medicaid Other | Admitting: Obstetrics and Gynecology

## 2021-05-01 ENCOUNTER — Other Ambulatory Visit: Payer: Self-pay

## 2021-05-01 ENCOUNTER — Encounter: Payer: Self-pay | Admitting: Obstetrics and Gynecology

## 2021-05-01 ENCOUNTER — Inpatient Hospital Stay
Admission: EM | Admit: 2021-05-01 | Discharge: 2021-05-02 | DRG: 806 | Disposition: A | Discharge status: Home / Self Care | Payer: BC Managed Care – PPO | Attending: Obstetrics and Gynecology | Admitting: Obstetrics and Gynecology

## 2021-05-01 DIAGNOSIS — D6959 Other secondary thrombocytopenia: Secondary | ICD-10-CM | POA: Diagnosis not present

## 2021-05-01 DIAGNOSIS — O9912 Other diseases of the blood and blood-forming organs and certain disorders involving the immune mechanism complicating childbirth: Secondary | ICD-10-CM | POA: Diagnosis present

## 2021-05-01 DIAGNOSIS — Z20822 Contact with and (suspected) exposure to covid-19: Secondary | ICD-10-CM | POA: Diagnosis not present

## 2021-05-01 DIAGNOSIS — Z3A38 38 weeks gestation of pregnancy: Secondary | ICD-10-CM

## 2021-05-01 DIAGNOSIS — Z3490 Encounter for supervision of normal pregnancy, unspecified, unspecified trimester: Secondary | ICD-10-CM

## 2021-05-01 DIAGNOSIS — O26893 Other specified pregnancy related conditions, third trimester: Secondary | ICD-10-CM | POA: Diagnosis not present

## 2021-05-01 DIAGNOSIS — O365930 Maternal care for other known or suspected poor fetal growth, third trimester, not applicable or unspecified: Secondary | ICD-10-CM | POA: Diagnosis not present

## 2021-05-01 LAB — CBC WITH DIFFERENTIAL/PLATELET
Abs Immature Granulocytes: 0.24 10*3/uL — ABNORMAL HIGH (ref 0.00–0.07)
Basophils Absolute: 0 10*3/uL (ref 0.0–0.1)
Basophils Relative: 0 %
Eosinophils Absolute: 0.1 10*3/uL (ref 0.0–0.5)
Eosinophils Relative: 1 %
HCT: 37 % (ref 36.0–46.0)
Hemoglobin: 12.7 g/dL (ref 12.0–15.0)
Immature Granulocytes: 1 %
Lymphocytes Relative: 12 %
Lymphs Abs: 2.2 10*3/uL (ref 0.7–4.0)
MCH: 30.5 pg (ref 26.0–34.0)
MCHC: 34.3 g/dL (ref 30.0–36.0)
MCV: 88.7 fL (ref 80.0–100.0)
Monocytes Absolute: 1.3 10*3/uL — ABNORMAL HIGH (ref 0.1–1.0)
Monocytes Relative: 7 %
Neutro Abs: 14.5 10*3/uL — ABNORMAL HIGH (ref 1.7–7.7)
Neutrophils Relative %: 79 %
Platelets: 117 10*3/uL — ABNORMAL LOW (ref 150–400)
RBC: 4.17 MIL/uL (ref 3.87–5.11)
RDW: 14.1 % (ref 11.5–15.5)
WBC: 18.3 10*3/uL — ABNORMAL HIGH (ref 4.0–10.5)
nRBC: 0 % (ref 0.0–0.2)

## 2021-05-01 LAB — TYPE AND SCREEN
ABO/RH(D): O POS
Antibody Screen: NEGATIVE

## 2021-05-01 LAB — RESP PANEL BY RT-PCR (FLU A&B, COVID) ARPGX2
Influenza A by PCR: NEGATIVE
Influenza B by PCR: NEGATIVE
SARS Coronavirus 2 by RT PCR: NEGATIVE

## 2021-05-01 MED ORDER — TETANUS-DIPHTH-ACELL PERTUSSIS 5-2.5-18.5 LF-MCG/0.5 IM SUSY
0.5000 mL | PREFILLED_SYRINGE | Freq: Once | INTRAMUSCULAR | Status: DC
  Filled 2021-05-01: qty 0.5

## 2021-05-01 MED ORDER — BENZOCAINE-MENTHOL 20-0.5 % EX AERO
1.0000 "application " | INHALATION_SPRAY | CUTANEOUS | Status: DC | PRN
  Administered 2021-05-01: 1 via TOPICAL
  Filled 2021-05-01: qty 56

## 2021-05-01 MED ORDER — AMMONIA AROMATIC IN INHA
RESPIRATORY_TRACT | Status: AC
  Filled 2021-05-01: qty 10

## 2021-05-01 MED ORDER — DOCUSATE SODIUM 100 MG PO CAPS
100.0000 mg | ORAL_CAPSULE | Freq: Two times a day (BID) | ORAL | Status: DC
  Administered 2021-05-01 – 2021-05-02 (×2): 100 mg via ORAL
  Filled 2021-05-01 (×2): qty 1

## 2021-05-01 MED ORDER — LIDOCAINE HCL (PF) 1 % IJ SOLN
INTRAMUSCULAR | Status: AC
  Filled 2021-05-01: qty 30

## 2021-05-01 MED ORDER — IBUPROFEN 600 MG PO TABS
600.0000 mg | ORAL_TABLET | Freq: Four times a day (QID) | ORAL | Status: DC
  Administered 2021-05-01 (×3): 600 mg via ORAL
  Filled 2021-05-01 (×4): qty 1

## 2021-05-01 MED ORDER — MISOPROSTOL 200 MCG PO TABS
ORAL_TABLET | ORAL | Status: AC
  Filled 2021-05-01: qty 4

## 2021-05-01 MED ORDER — PRENATAL MULTIVITAMIN CH
1.0000 | ORAL_TABLET | Freq: Every day | ORAL | Status: DC
  Administered 2021-05-01 – 2021-05-02 (×2): 1 via ORAL
  Filled 2021-05-01 (×2): qty 1

## 2021-05-01 MED ORDER — DIPHENHYDRAMINE HCL 25 MG PO CAPS: 25.0000 mg | ORAL_CAPSULE | Freq: Four times a day (QID) | ORAL | Status: DC | PRN

## 2021-05-01 MED ORDER — OXYTOCIN-SODIUM CHLORIDE 30-0.9 UT/500ML-% IV SOLN
INTRAVENOUS | Status: AC
  Filled 2021-05-01: qty 1000

## 2021-05-01 MED ORDER — ZOLPIDEM TARTRATE 5 MG PO TABS: 5.0000 mg | ORAL_TABLET | Freq: Every evening | ORAL | Status: DC | PRN

## 2021-05-01 MED ORDER — LACTATED RINGERS IV SOLN: INTRAVENOUS | Status: DC

## 2021-05-01 MED ORDER — SIMETHICONE 80 MG PO CHEW: 80.0000 mg | CHEWABLE_TABLET | ORAL | Status: DC | PRN

## 2021-05-01 MED ORDER — OXYTOCIN 10 UNIT/ML IJ SOLN
INTRAMUSCULAR | Status: AC
  Filled 2021-05-01: qty 2

## 2021-05-01 MED ORDER — OXYTOCIN-SODIUM CHLORIDE 30-0.9 UT/500ML-% IV SOLN: 2.5000 [IU]/h | INTRAVENOUS | Status: DC | PRN

## 2021-05-01 MED ORDER — TERBUTALINE SULFATE 1 MG/ML IJ SOLN: 0.2500 mg | Freq: Once | INTRAMUSCULAR | Status: DC | PRN

## 2021-05-01 MED ORDER — ACETAMINOPHEN 325 MG PO TABS
650.0000 mg | ORAL_TABLET | ORAL | Status: DC | PRN
  Administered 2021-05-01 – 2021-05-02 (×3): 650 mg via ORAL
  Filled 2021-05-01 (×3): qty 2

## 2021-05-01 MED ORDER — OXYTOCIN-SODIUM CHLORIDE 30-0.9 UT/500ML-% IV SOLN
2.5000 [IU]/h | INTRAVENOUS | Status: DC
  Administered 2021-05-01: 2.5 [IU]/h via INTRAVENOUS

## 2021-05-01 MED ORDER — OXYTOCIN BOLUS FROM INFUSION
333.0000 mL | Freq: Once | INTRAVENOUS | Status: AC
  Administered 2021-05-01: 333 mL via INTRAVENOUS

## 2021-05-01 MED ORDER — OXYTOCIN-SODIUM CHLORIDE 30-0.9 UT/500ML-% IV SOLN: 1.0000 m[IU]/min | INTRAVENOUS | Status: DC

## 2021-05-01 MED ORDER — OXYCODONE-ACETAMINOPHEN 5-325 MG PO TABS
1.0000 | ORAL_TABLET | ORAL | Status: DC | PRN
  Filled 2021-05-01: qty 1

## 2021-05-01 NOTE — OB Triage Note (Signed)
Pt Presents to L/D triage with reported contractions that began this morning and have increased in frequency and intensity. Pt describes pain as intermittent, rated 10/10. Pt reports positive fetal movement and no bleeding or LOF. Monitors applied and assessing.  MD notified of pt arrival.

## 2021-05-01 NOTE — H&P (Signed)
History and Physical   HPI  Kendi Defalco is a 35 y.o. G2P1001 at [redacted]w[redacted]d Estimated Date of Delivery: 05/09/21 who is being admitted for labor management.  Began contractions earlier today.  Denies bleeding, leakage of fluid.  H/O baby with 6-9% growth.  OB History  OB History  Gravida Para Term Preterm AB Living  2 1 1  0 0 1  SAB IAB Ectopic Multiple Live Births  0 0 0 0 1    # Outcome Date GA Lbr Len/2nd Weight Sex Delivery Anes PTL Lv  2 Current           1 Term 03/06/13 [redacted]w[redacted]d 07:33 / 00:15 2974 g F Vag-Spont EPI  LIV     Name: KAYAH, HECKER     Apgar1: 9  Apgar5: 9    PROBLEM LIST  Pregnancy complications or risks: Patient Active Problem List   Diagnosis Date Noted   Poor fetal growth affecting management of mother in third trimester 04/17/2021   Benign gestational thrombocytopenia in third trimester (HCC) 03/27/2021   Placenta succenturiate lobe affecting fetus 03/27/2021   Fetal cardiac echogenic focus 01/02/2021   Thrombocytopenia, unspecified (HCC) 02/08/2013   LGSIL (low grade squamous intraepithelial dysplasia) 10/03/2012   FHx: Huntington's chorea 09/09/2012    Prenatal labs and studies: ABO, Rh: O/Positive/-- (12/07 1009) Antibody:   Rubella: 1.44 (12/07 1009) RPR: Non Reactive (04/12 1011)  HBsAg: Negative (12/07 1009)  HIV: Non Reactive (12/07 1009)  04-03-2006-- (05/31 1524)   Past Medical History:  Diagnosis Date   Infection    Yeast;was treated 08/15/12 w/ Monistat 7   Infection    BV x 2   No pertinent past medical history    Vaginal Pap smear, abnormal      Past Surgical History:  Procedure Laterality Date   denies     NO PAST SURGERIES       Medications    Current Discharge Medication List     CONTINUE these medications which have NOT CHANGED   Details  Prenatal Vit-Fe Fumarate-FA (PRENATAL VITAMIN PO) Take by mouth.         Allergies  Patient has no known allergies.  Review of Systems  Pertinent  items noted in HPI and remainder of comprehensive ROS otherwise negative.  Physical Exam  Temp 98.4 F (36.9 C) (Oral)   Resp 18   Ht 5\' 2"  (1.575 m)   Wt 57.6 kg   LMP 08/01/2020 (Exact Date) Comment: normal period  BMI 23.23 kg/m   Lungs:  CTA B Cardio: RRR without M/R/G Abd: Soft, gravid, NT Presentation: cephalic EXT: No C/C/ 1+ Edema DTRs: 2+ B CERVIX: Dilation: 7 Effacement (%): 80, 90 Cervical Position: Middle Station: -1 Exam by:: 08/03/2020 RN  See Prenatal records for more detailed PE.     FHR:  Variability: Good {> 6 bpm)  Toco: Uterine Contractions: Q2 min  Test Results  No results found for this or any previous visit (from the past 24 hour(s)).   Assessment   G2P1001 at [redacted]w[redacted]d Estimated Date of Delivery: 05/09/21   Category 2 strip  Patient Active Problem List   Diagnosis Date Noted   Poor fetal growth affecting management of mother in third trimester 04/17/2021   Benign gestational thrombocytopenia in third trimester (HCC) 03/27/2021   Placenta succenturiate lobe affecting fetus 03/27/2021   Fetal cardiac echogenic focus 01/02/2021   Thrombocytopenia, unspecified (HCC) 02/08/2013   LGSIL (low grade squamous intraepithelial dysplasia) 10/03/2012   FHx: Huntington's chorea  09/09/2012    Plan  1. Admit to L&D :   2. EFM: -- Category 2 3. Stadol or Epidural if desired.   4. Admission labs  5. Expect vaginal delivery  [name], M.D. 05/01/2021 8:16 AM

## 2021-05-01 NOTE — Lactation Note (Signed)
This note was copied from a baby's chart. Lactation Consultation Note  Patient Name: Kathleen Romero [id number] Date: 05/01/2021 Reason for consult: Follow-up assessment Age:34 hours  Lactation follow-up now on MBU. Baby active at breast, appeared to have a shallow latch; LC pulled down on chin, mom reports it feeling more comfortable.  Other than slight help with latch mom was independent and had no questions or concerns. Reports her daughter being easy to latch as well, just ended up with a lactose concern.  Encouraged continued feeding with cues, provided tips for keeping baby awake/alert throughout feeding, and discussed output expectations to indicate good transfer.   Whiteboard was updated with lactation name and contact number; encouraged to call as needed.  Maternal Data Has patient been taught Hand Expression?: Yes Does the patient have breastfeeding experience prior to this delivery?: Yes  Feeding Mother's Current Feeding Choice: Breast Milk  LATCH Score Latch: Grasps breast easily, tongue down, lips flanged, rhythmical sucking.  Audible Swallowing: A few with stimulation  Type of Nipple: Everted at rest and after stimulation  Comfort (Breast/Nipple): Soft / non-tender  Hold (Positioning): No assistance needed to correctly position infant at breast.  LATCH Score: 9   Lactation Tools Discussed/Used    Interventions Interventions: Breast feeding basics reviewed;Assisted with latch;Education (flanged bottom lip)  Discharge    Consult Status Consult Status: Follow-up Date: 05/01/21 Follow-up type: In-patient    Danford Bad 05/01/2021, 2:34 PM

## 2021-05-01 NOTE — Lactation Note (Signed)
This note was copied from a baby's chart. Lactation Consultation Note  Patient Name: Kathleen Romero [id number] Date: 05/01/2021 Reason for consult: L&D Initial assessment;Initial assessment;Early term 37-38.6wks Age:34 hours  Initial lactation visit in L&D, baby born SVD 2 hours ago to G2P2 mom. Mom breastfed her first, now 57yrs old, for 3 months- cites lactose issues and baby was switched to soy formula.  Baby in cradle position by Transition RN when [facility] entered. LC assisted with latching, baby grasped the breast easily with support and guidance and began a strong rhythmic suck pattern. Mom has everted nipples bilaterally with compressible breast tissue. No pain/discomfort noted throughout feed of 10 minutes.   LC reviewed newborn stomach size, feeding pattern and behaviors in first 24HOL, encouraged 8-12 feeding attempts, hand expression, and options for spoon feeding expressed colostrum. Encouraged ongoing skin to skin for breastfeeding and temperature regulation.   LC to follow-up on MBU.  Maternal Data Has patient been taught Hand Expression?: Yes Does the patient have breastfeeding experience prior to this delivery?: Yes How long did the patient breastfeed?: 3 months  Feeding Mother's Current Feeding Choice: Breast Milk  LATCH Score Latch: Repeated attempts needed to sustain latch, nipple held in mouth throughout feeding, stimulation needed to elicit sucking reflex.  Audible Swallowing: A few with stimulation  Type of Nipple: Everted at rest and after stimulation  Comfort (Breast/Nipple): Soft / non-tender  Hold (Positioning): Assistance needed to correctly position infant at breast and maintain latch.  LATCH Score: 7   Lactation Tools Discussed/Used    Interventions Interventions: Breast feeding basics reviewed;Assisted with latch;Hand express;Adjust position;Position options;Education  Discharge    Consult Status Consult Status: Follow-up Date:  05/01/21 Follow-up type: In-patient    Danford Bad 05/01/2021, 10:44 AM

## 2021-05-02 DIAGNOSIS — Z3A38 38 weeks gestation of pregnancy: Secondary | ICD-10-CM

## 2021-05-02 LAB — RPR: RPR Ser Ql: NONREACTIVE

## 2021-05-02 MED ORDER — IBUPROFEN 600 MG PO TABS
600.0000 mg | ORAL_TABLET | Freq: Four times a day (QID) | ORAL | Status: DC
  Administered 2021-05-02: 600 mg via ORAL
  Filled 2021-05-02: qty 1

## 2021-05-02 MED ORDER — IBUPROFEN 600 MG PO TABS: 600.0000 mg | ORAL_TABLET | Freq: Four times a day (QID) | ORAL | 0 refills | Status: DC

## 2021-05-02 NOTE — Discharge Summary (Signed)
Patient Name: Kathleen Romero DOB: Aug 11, 1986 MRN: [id number]                            Discharge Summary  Date of Admission: 05/01/2021 Date of Discharge: 05/02/2021 Delivering Provider: Linzie Collin   Admitting Diagnosis: Indication for care in labor or delivery [O75.9] Pregnancy [Z34.90] at [redacted]w[redacted]d Secondary diagnosis:  Active Problems:   Indication for care in labor or delivery   Pregnancy IUGR infant  Mode of Delivery: normal spontaneous vaginal delivery              Discharge diagnosis: Term Pregnancy Delivered      Intrapartum Procedures: Atificial rupture of membranes   Post partum procedures:   Complications:                      Discharge Day SOAP Note:  Progress Note - Vaginal Delivery  Kathleen Romero is a 35 y.o. G2P2002 now PP day 1 s/p Vaginal, Spontaneous .  Subjective  The patient has the following complaints: has no unusual complaints  Pain is controlled with current medications.   Patient is urinating without difficulty.  She is ambulating well.   Desires discharge  Objective  Vital signs: BP 106/67 (BP Location: Left Arm)   Pulse 88   Temp 98 F (36.7 C) (Oral)   Resp 16   Ht 5\' 2"  (1.575 m)   Wt 57.6 kg   LMP 08/01/2020 (Exact Date) Comment: normal period  SpO2 96%   Breastfeeding Unknown   BMI 23.23 kg/m   Physical Exam: Gen: NAD Fundus Fundal Tone: Firm  Lochia Amount: Scant        Data Review Labs: Lab Results  Component Value Date   WBC 18.3 (H) 05/01/2021   HGB 12.7 05/01/2021   HCT 37.0 05/01/2021   MCV 88.7 05/01/2021   PLT 117 (L) 05/01/2021   CBC Latest Ref Rng & Units 05/01/2021 04/17/2021 02/27/2021  WBC 4.0 - 10.5 K/uL 18.3(H) 12.7(H) 11.1(H)  Hemoglobin 12.0 - 15.0 g/dL 04/29/2021 54.5 62.5  Hematocrit 36.0 - 46.0 % 37.0 33.8(L) 35.2  Platelets 150 - 400 K/uL 117(L) 129(L) 116(L)   O POS  Edinburgh Score: Edinburgh Postnatal Depression Scale Screening Tool 05/02/2021  I have been able to laugh and see the  funny side of things. 0  I have looked forward with enjoyment to things. 0  I have blamed myself unnecessarily when things went wrong. 0  I have been anxious or worried for no good reason. 1  I have felt scared or panicky for no good reason. 0  Things have been getting on top of me. 1  I have been so unhappy that I have had difficulty sleeping. 0  I have felt sad or miserable. 0  I have been so unhappy that I have been crying. 0  The thought of harming myself has occurred to me. 0  Edinburgh Postnatal Depression Scale Total 2    Assessment/Plan  Active Problems:   Indication for care in labor or delivery   Pregnancy    Plan for discharge today.  Discharge Instructions: Per After Visit Summary. Activity: Advance as tolerated. Pelvic rest for 6 weeks.  Also refer to After Visit Summary Diet: Regular Medications: Allergies as of 05/02/2021   No Known Allergies      Medication List     TAKE these medications    ibuprofen 600 MG tablet  Commonly known as: ADVIL Take 1 tablet (600 mg total) by mouth every 6 (six) hours.   PRENATAL VITAMIN PO Take by mouth.       Outpatient follow up:   Follow-up Information     [facility]. Schedule an appointment as soon as possible for a visit in 6 week(s).   Contact information: [address]  [address] [place] [address] [phone]        Linzie Collin, MD Follow up in 4 week(s).   Specialties: Obstetrics and Gynecology, Radiology Contact information: [address] [place] [address] [phone]                Postpartum contraception: Will discuss at first office visit post-partum  Discharged Condition: good  Discharged to: home  Newborn Data: Disposition:home with mother  Apgars: APGAR (1 MIN): 8   APGAR (5 MINS): 9   APGAR (10 MINS):    Baby Feeding: Breast    [name], M.D. 05/02/2021 12:23 PM

## 2021-05-02 NOTE — Discharge Instructions (Signed)

## 2021-05-02 NOTE — Lactation Note (Signed)
This note was copied from a baby's chart. Lactation Consultation Note  Patient Name: Kathleen Romero [id number] Date: 05/02/2021 Reason for consult: Follow-up assessment;Early term 37-38.6wks Age:34 hours  Lactation follow-up. Mom and baby breastfeeding when [facility] entered room. Mom reports no concerns, no pain or discomfort. LC praised mom for good positioning and alignment of baby, latch displayed flanged lips, and audible swallows pointed out. LC added support pillow under arm for mom, again praised mom for her independence. LC provided education on cluster feeding, growth spurts, milk supply and demand, and normal course of lactation. Encouraged continued on demand feedings, feeding with early cues, and skin to skin.  Plan for circumcision later on today, briefly discussed the impact this may have on baby's desire to eat for a while (approx 6 hrs), and encouraged hand expression and continued offerings of breast frequently aiming for 8-12 feedings within 24 hours, and growing output of both wet and stool diapers.  Encouraged to call out today with questions, breastfeeding assistance.  Maternal Data Has patient been taught Hand Expression?: Yes Does the patient have breastfeeding experience prior to this delivery?: Yes How long did the patient breastfeed?: 3 months  Feeding Mother's Current Feeding Choice: Breast Milk  LATCH Score Latch: Grasps breast easily, tongue down, lips flanged, rhythmical sucking.  Audible Swallowing: A few with stimulation  Type of Nipple: Everted at rest and after stimulation  Comfort (Breast/Nipple): Soft / non-tender  Hold (Positioning): No assistance needed to correctly position infant at breast.  LATCH Score: 9   Lactation Tools Discussed/Used    Interventions Interventions: Breast feeding basics reviewed;Education;Support pillows  Discharge    Consult Status Consult Status: PRN Date: 05/02/21 Follow-up type: Call as needed    Danford Bad 05/02/2021, 10:56 AM

## 2021-05-02 NOTE — Progress Notes (Signed)
Patient discharged home with family.  Discharge instructions, when to follow up, and prescriptions reviewed with patient.  Patient verbalized understanding. Patient will be escorted out by auxiliary.   

## 2021-05-03 LAB — SURGICAL PATHOLOGY

## 2021-05-05 ENCOUNTER — Ambulatory Visit: Payer: Self-pay

## 2021-05-05 NOTE — Lactation Note (Signed)
This note was copied from a baby's chart. Lactation Consultation Note  Patient Name: Kathleen Romero [name] Date: 05/05/2021 Reason for consult: Follow-up assessment;Hyperbilirubinemia Age:34 days  P2, Baby has been having lots of shorts feedings. Observed feeding and after 5 min baby came off breast. Suggest mother burp and re-latch which she did. Noted frequent swallows and mother's breasts are full. Suggest if baby is having lots of short feeding due to increased sleepiness as a result of hyperbilirubinemia, mother should pump in addition and give volume back to baby.  Mother states she has DEBP at home. Discussed monitoring voids/stools and what to expect now and in months to come. Recommend when questions arise to call LC at [facility] or make OP appt.  Parents seem receptive to all teaching.   LATCH Score Latch: Grasps breast easily, tongue down, lips flanged, rhythmical sucking.  Audible Swallowing: A few with stimulation  Type of Nipple: Everted at rest and after stimulation  Comfort (Breast/Nipple): Soft / non-tender  Hold (Positioning): Assistance needed to correctly position infant at breast and maintain latch.  LATCH Score: 8   Lactation Tools Discussed/Used    Interventions Interventions: Breast feeding basics reviewed;Assisted with latch;Skin to skin;DEBP;Education  Discharge Discharge Education: Engorgement and breast care;Warning signs for feeding baby Pump: DEBP  Consult Status Consult Status: Complete Date: 05/05/21    Dahlia Byes [facility] 05/05/2021, 2:28 PM

## 2021-05-09 ENCOUNTER — Inpatient Hospital Stay: Admit: 2021-05-09 | Payer: Self-pay

## 2021-05-18 DIAGNOSIS — Z419 Encounter for procedure for purposes other than remedying health state, unspecified: Secondary | ICD-10-CM | POA: Diagnosis not present

## 2021-05-29 ENCOUNTER — Other Ambulatory Visit: Payer: Self-pay

## 2021-05-29 ENCOUNTER — Encounter: Payer: Self-pay | Admitting: Obstetrics and Gynecology

## 2021-05-29 ENCOUNTER — Ambulatory Visit (INDEPENDENT_AMBULATORY_CARE_PROVIDER_SITE_OTHER): Payer: Medicaid Other | Admitting: Obstetrics and Gynecology

## 2021-05-29 DIAGNOSIS — Z3009 Encounter for other general counseling and advice on contraception: Secondary | ICD-10-CM

## 2021-05-29 NOTE — Progress Notes (Signed)
HPI:      [name] is a 35 y.o. G2P2002 who LMP was No LMP recorded.  Subjective:   She presents today for her postpartum visit.  She reports she is doing well.  Continues to breast-feed full-time.  She would like to return to work.  She would like to discuss birth control methods: Depo-Provera versus IUD.  She has not yet resumed intercourse.    Hx: The following portions of the patient's history were reviewed and updated as appropriate:             She  has a past medical history of Infection, Infection, No pertinent past medical history, and Vaginal Pap smear, abnormal. She does not have any pertinent problems on file. She  has a past surgical history that includes No past surgeries and denies. Her family history includes Alcohol abuse in her father; Asthma in her paternal uncle; Breast cancer in her paternal aunt; Hypertension in her father and mother; Other in an other family member. She  reports that she has quit smoking. Her smoking use included cigars. She has a 3.00 pack-year smoking history. She has never used smokeless tobacco. She reports previous alcohol use. She reports previous drug use. Frequency: 1.00 time per week. Drug: Marijuana. She has a current medication list which includes the following prescription(s): prenatal vit-fe fumarate-fa and ibuprofen. She has No Known Allergies.       Review of Systems:  Review of Systems  Constitutional: Denied constitutional symptoms, night sweats, recent illness, fatigue, fever, insomnia and weight loss.  Eyes: Denied eye symptoms, eye pain, photophobia, vision change and visual disturbance.  Ears/Nose/Throat/Neck: Denied ear, nose, throat or neck symptoms, hearing loss, nasal discharge, sinus congestion and sore throat.  Cardiovascular: Denied cardiovascular symptoms, arrhythmia, chest pain/pressure, edema, exercise intolerance, orthopnea and palpitations.  Respiratory: Denied pulmonary symptoms, asthma, pleuritic pain,  productive sputum, cough, dyspnea and wheezing.  Gastrointestinal: Denied, gastro-esophageal reflux, melena, nausea and vomiting.  Genitourinary: Denied genitourinary symptoms including symptomatic vaginal discharge, pelvic relaxation issues, and urinary complaints.  Musculoskeletal: Denied musculoskeletal symptoms, stiffness, swelling, muscle weakness and myalgia.  Dermatologic: Denied dermatology symptoms, rash and scar.  Neurologic: Denied neurology symptoms, dizziness, headache, neck pain and syncope.  Psychiatric: Denied psychiatric symptoms, anxiety and depression.  Endocrine: Denied endocrine symptoms including hot flashes and night sweats.   Meds:   Current Outpatient Medications on File Prior to Visit  Medication Sig Dispense Refill   Prenatal Vit-Fe Fumarate-FA (PRENATAL VITAMIN PO) Take by mouth.     ibuprofen (ADVIL) 600 MG tablet Take 1 tablet (600 mg total) by mouth every 6 (six) hours. (Patient not taking: Reported on 05/29/2021) 30 tablet 0   No current facility-administered medications on file prior to visit.      Objective:     Vitals:   05/29/21 1500  BP: 114/71  Pulse: 72   Filed Weights   05/29/21 1500  Weight: 108 lb 8 oz (49.2 kg)              Exam at IUD placement  Assessment:    [id number] Patient Active Problem List   Diagnosis Date Noted   Indication for care in labor or delivery 05/01/2021   Pregnancy 05/01/2021   Poor fetal growth affecting management of mother in third trimester 04/17/2021   Benign gestational thrombocytopenia in third trimester (HCC) 03/27/2021   Placenta succenturiate lobe affecting fetus 03/27/2021   Fetal cardiac echogenic focus 01/02/2021   Thrombocytopenia, unspecified (HCC) 02/08/2013   LGSIL (low  grade squamous intraepithelial dysplasia) 10/03/2012   FHx: Huntington's chorea 09/09/2012     1. Postpartum care and examination immediately after delivery   2. Birth control counseling        Plan:            1.   Patient may resume normal activities including going back to work.  Advised condoms for birth control until IUD placed \ 2.  We have discussed IUD versus Depo-Provera and patient has chosen IUD at this time.  She will present next week for IUD placement.  Orders No orders of the defined types were placed in this encounter.   No orders of the defined types were placed in this encounter.     F/U  Return in about 1 week (around 06/05/2021).  [name], M.D. 05/29/2021 3:27 PM

## 2021-06-06 ENCOUNTER — Ambulatory Visit (INDEPENDENT_AMBULATORY_CARE_PROVIDER_SITE_OTHER): Payer: BLUE CROSS/BLUE SHIELD | Admitting: Obstetrics and Gynecology

## 2021-06-06 ENCOUNTER — Encounter: Payer: Self-pay | Admitting: Obstetrics and Gynecology

## 2021-06-06 ENCOUNTER — Other Ambulatory Visit: Payer: Self-pay

## 2021-06-06 VITALS — BP 122/81 | HR 80 | Ht 62.0 in | Wt 108.0 lb

## 2021-06-06 DIAGNOSIS — Z30430 Encounter for insertion of intrauterine contraceptive device: Secondary | ICD-10-CM | POA: Diagnosis not present

## 2021-06-06 DIAGNOSIS — Z3009 Encounter for other general counseling and advice on contraception: Secondary | ICD-10-CM

## 2021-06-06 LAB — POCT URINE PREGNANCY: Preg Test, Ur: NEGATIVE

## 2021-06-06 NOTE — Progress Notes (Signed)
HPI:      [name] is a 35 y.o. G2P2002 who LMP was No LMP recorded.  Subjective:   She presents today for IUD insertion.  She desires IUD for birth control.  She is breast-feeding full-time.    Hx: The following portions of the patient's history were reviewed and updated as appropriate:             She  has a past medical history of Infection, Infection, No pertinent past medical history, and Vaginal Pap smear, abnormal. She does not have any pertinent problems on file. She  has a past surgical history that includes No past surgeries and denies. Her family history includes Alcohol abuse in her father; Asthma in her paternal uncle; Breast cancer in her paternal aunt; Hypertension in her father and mother; Other in an other family member. She  reports that she has quit smoking. Her smoking use included cigars. She has a 3.00 pack-year smoking history. She has never used smokeless tobacco. She reports previous alcohol use. She reports previous drug use. Frequency: 1.00 time per week. Drug: Marijuana. She has a current medication list which includes the following prescription(s): ibuprofen and prenatal vit-fe fumarate-fa. She has No Known Allergies.       Review of Systems:  Review of Systems  Constitutional: Denied constitutional symptoms, night sweats, recent illness, fatigue, fever, insomnia and weight loss.  Eyes: Denied eye symptoms, eye pain, photophobia, vision change and visual disturbance.  Ears/Nose/Throat/Neck: Denied ear, nose, throat or neck symptoms, hearing loss, nasal discharge, sinus congestion and sore throat.  Cardiovascular: Denied cardiovascular symptoms, arrhythmia, chest pain/pressure, edema, exercise intolerance, orthopnea and palpitations.  Respiratory: Denied pulmonary symptoms, asthma, pleuritic pain, productive sputum, cough, dyspnea and wheezing.  Gastrointestinal: Denied, gastro-esophageal reflux, melena, nausea and vomiting.  Genitourinary: Denied  genitourinary symptoms including symptomatic vaginal discharge, pelvic relaxation issues, and urinary complaints.  Musculoskeletal: Denied musculoskeletal symptoms, stiffness, swelling, muscle weakness and myalgia.  Dermatologic: Denied dermatology symptoms, rash and scar.  Neurologic: Denied neurology symptoms, dizziness, headache, neck pain and syncope.  Psychiatric: Denied psychiatric symptoms, anxiety and depression.  Endocrine: Denied endocrine symptoms including hot flashes and night sweats.   Meds:   Current Outpatient Medications on File Prior to Visit  Medication Sig Dispense Refill   ibuprofen (ADVIL) 600 MG tablet Take 1 tablet (600 mg total) by mouth every 6 (six) hours. (Patient not taking: Reported on 05/29/2021) 30 tablet 0   Prenatal Vit-Fe Fumarate-FA (PRENATAL VITAMIN PO) Take by mouth.     No current facility-administered medications on file prior to visit.    Objective:     Vitals:   06/06/21 1419  BP: 122/81  Pulse: 80    Physical examination   Pelvic:   Vulva: Normal appearance.  No lesions.  Vagina: No lesions or abnormalities noted.  Support: Normal pelvic support.  Urethra No masses tenderness or scarring.  Meatus Normal size without lesions or prolapse.  Cervix: Normal appearance.  No lesions.  Anus: Normal exam.  No lesions.  Perineum: Normal exam.  No lesions.        Bimanual   Uterus: Normal size.  Non-tender.  Mobile.  AV.  Adnexae: No masses.  Non-tender to palpation.  Cul-de-sac: Negative for abnormality.   IUD Procedure Pt has read the booklet and signed the appropriate forms regarding the Mirena IUD.  All of her questions have been answered.   The cervix was cleansed with betadine solution.  After sounding the uterus and noting the position,  the IUD was placed in the usual manner without problem.  The string was cut to the appropriate length.  The patient tolerated the procedure well.            NDC # = [id number]   Assessment:     [id number] Patient Active Problem List   Diagnosis Date Noted   Indication for care in labor or delivery 05/01/2021   Pregnancy 05/01/2021   Poor fetal growth affecting management of mother in third trimester 04/17/2021   Benign gestational thrombocytopenia in third trimester (HCC) 03/27/2021   Placenta succenturiate lobe affecting fetus 03/27/2021   Fetal cardiac echogenic focus 01/02/2021   Thrombocytopenia, unspecified (HCC) 02/08/2013   LGSIL (low grade squamous intraepithelial dysplasia) 10/03/2012   FHx: Huntington's chorea 09/09/2012     1. Encounter for insertion of mirena IUD   2. Birth control counseling       Plan:             F/U  Return in about 4 weeks (around 07/04/2021) for For IUD f/u.  [name], M.D. 06/06/2021 2:24 PM

## 2021-06-14 ENCOUNTER — Encounter: Payer: Self-pay | Admitting: Obstetrics and Gynecology

## 2021-06-14 ENCOUNTER — Ambulatory Visit (INDEPENDENT_AMBULATORY_CARE_PROVIDER_SITE_OTHER): Payer: BLUE CROSS/BLUE SHIELD | Admitting: Obstetrics and Gynecology

## 2021-06-14 ENCOUNTER — Other Ambulatory Visit: Payer: Self-pay

## 2021-06-14 VITALS — BP 115/77 | HR 77 | Ht 62.0 in | Wt 106.2 lb

## 2021-06-14 DIAGNOSIS — R102 Pelvic and perineal pain: Secondary | ICD-10-CM | POA: Diagnosis not present

## 2021-06-14 NOTE — Progress Notes (Signed)
HPI:      [name] is a 35 y.o. [id number] who LMP was No LMP recorded.  Subjective:   She presents today stating that she has had pain with intercourse.  She is not sure if it is because she recently underwent childbirth or if because she has an IUD.  She would "rather be safe than sorry".    Hx: The following portions of the patient's history were reviewed and updated as appropriate:             She  has a past medical history of Infection, Infection, No pertinent past medical history, and Vaginal Pap smear, abnormal. She does not have any pertinent problems on file. She  has a past surgical history that includes No past surgeries and denies. Her family history includes Alcohol abuse in her father; Asthma in her paternal uncle; Breast cancer in her paternal aunt; Hypertension in her father and mother; Other in an other family member. She  reports that she has quit smoking. Her smoking use included cigars. She has a 3.00 pack-year smoking history. She has never used smokeless tobacco. She reports previous alcohol use. She reports previous drug use. Frequency: 1.00 time per week. Drug: Marijuana. She has a current medication list which includes the following prescription(s): ibuprofen and prenatal vit-fe fumarate-fa. She has No Known Allergies.       Review of Systems:  Review of Systems  Constitutional: Denied constitutional symptoms, night sweats, recent illness, fatigue, fever, insomnia and weight loss.  Eyes: Denied eye symptoms, eye pain, photophobia, vision change and visual disturbance.  Ears/Nose/Throat/Neck: Denied ear, nose, throat or neck symptoms, hearing loss, nasal discharge, sinus congestion and sore throat.  Cardiovascular: Denied cardiovascular symptoms, arrhythmia, chest pain/pressure, edema, exercise intolerance, orthopnea and palpitations.  Respiratory: Denied pulmonary symptoms, asthma, pleuritic pain, productive sputum, cough, dyspnea and wheezing.   Gastrointestinal: Denied, gastro-esophageal reflux, melena, nausea and vomiting.  Genitourinary: See HPI for additional information.  Musculoskeletal: Denied musculoskeletal symptoms, stiffness, swelling, muscle weakness and myalgia.  Dermatologic: Denied dermatology symptoms, rash and scar.  Neurologic: Denied neurology symptoms, dizziness, headache, neck pain and syncope.  Psychiatric: Denied psychiatric symptoms, anxiety and depression.  Endocrine: Denied endocrine symptoms including hot flashes and night sweats.   Meds:   Current Outpatient Medications on File Prior to Visit  Medication Sig Dispense Refill   ibuprofen (ADVIL) 600 MG tablet Take 1 tablet (600 mg total) by mouth every 6 (six) hours. (Patient not taking: Reported on 05/29/2021) 30 tablet 0   Prenatal Vit-Fe Fumarate-FA (PRENATAL VITAMIN PO) Take by mouth.     No current facility-administered medications on file prior to visit.      Objective:     Vitals:   06/14/21 1006  BP: 115/77  Pulse: 77   Filed Weights   06/14/21 1006  Weight: 106 lb 3.2 oz (48.2 kg)              Physical examination   Pelvic:   Vulva: Normal appearance.  No lesions.  Vagina: No lesions or abnormalities noted.  Support: Normal pelvic support.  Urethra No masses tenderness or scarring.  Meatus Normal size without lesions or prolapse.  Cervix: Normal appearance.  No lesions. IUD strings noted at cervical os.  Anus: Normal exam.  No lesions.  Perineum: Normal exam.  No lesions.        Bimanual   Uterus: Normal size.  Non-tender.  Mobile.  AV.  Adnexae: No masses.  Non-tender to palpation.  Cul-de-sac:  Negative for abnormality.     Assessment:    [id number] Patient Active Problem List   Diagnosis Date Noted   Indication for care in labor or delivery 05/01/2021   Pregnancy 05/01/2021   Poor fetal growth affecting management of mother in third trimester 04/17/2021   Benign gestational thrombocytopenia in third trimester (HCC)  03/27/2021   Placenta succenturiate lobe affecting fetus 03/27/2021   Fetal cardiac echogenic focus 01/02/2021   Thrombocytopenia, unspecified (HCC) 02/08/2013   LGSIL (low grade squamous intraepithelial dysplasia) 10/03/2012   FHx: Huntington's chorea 09/09/2012     1. Pelvic pain in female     IUD correctly positioned based on string check.  Pelvic pain not reproduced with bimanual exam.   Plan:            1.  Pain possibly secondary to recent childbirth.  Doubt IUD.  2.  Expectant management-I expect pain to subside.  I have reassured the patient.  If pain continues consider pelvic ultrasound.  Then consider IUD removal after that. Orders No orders of the defined types were placed in this encounter.   No orders of the defined types were placed in this encounter.     F/U  Return in about 4 weeks (around 07/12/2021). I spent 21 minutes involved in the care of this patient preparing to see the patient by obtaining and reviewing her medical history (including labs, imaging tests and prior procedures), documenting clinical information in the electronic health record (EHR), counseling and coordinating care plans, writing and sending prescriptions, ordering tests or procedures and directly communicating with the patient by discussing pertinent items from her history and physical exam as well as detailing my assessment and plan as noted above so that she has an informed understanding.  All of her questions were answered.  [name], M.D. 06/14/2021 10:19 AM

## 2021-07-05 ENCOUNTER — Encounter: Payer: Self-pay | Admitting: Obstetrics and Gynecology

## 2021-07-05 ENCOUNTER — Other Ambulatory Visit: Payer: Self-pay

## 2021-07-05 ENCOUNTER — Ambulatory Visit (INDEPENDENT_AMBULATORY_CARE_PROVIDER_SITE_OTHER): Payer: BLUE CROSS/BLUE SHIELD | Admitting: Obstetrics and Gynecology

## 2021-07-05 VITALS — BP 114/75 | HR 71 | Ht 62.0 in | Wt 107.0 lb

## 2021-07-05 DIAGNOSIS — Z30431 Encounter for routine checking of intrauterine contraceptive device: Secondary | ICD-10-CM | POA: Diagnosis not present

## 2021-07-05 NOTE — Progress Notes (Signed)
HPI:      [name] is a 35 y.o. [phone] who LMP was No LMP recorded (lmp unknown).  Subjective:   She presents today for her regularly scheduled IUD follow-up.  She had a problem a few weeks ago with some pelvic pain and bleeding.  This has resolved.  She does state that she occasionally has pain with bowel movements she describes it as a burning pain.  She then notices some blood on the toilet tissue.    Hx: The following portions of the patient's history were reviewed and updated as appropriate:             She  has a past medical history of Infection, Infection, No pertinent past medical history, and Vaginal Pap smear, abnormal. She does not have any pertinent problems on file. She  has a past surgical history that includes No past surgeries and denies. Her family history includes Alcohol abuse in her father; Asthma in her paternal uncle; Breast cancer in her paternal aunt; Hypertension in her father and mother; Other in an other family member. She  reports that she has quit smoking. Her smoking use included cigars. She has a 3.00 pack-year smoking history. She has never used smokeless tobacco. She reports that she does not currently use alcohol. She reports that she does not currently use drugs after having used the following drugs: Marijuana. Frequency: 1.00 time per week. She has a current medication list which includes the following prescription(s): levonorgestrel, prenatal vit-fe fumarate-fa, and ibuprofen. She has No Known Allergies.       Review of Systems:  Review of Systems  Constitutional: Denied constitutional symptoms, night sweats, recent illness, fatigue, fever, insomnia and weight loss.  Eyes: Denied eye symptoms, eye pain, photophobia, vision change and visual disturbance.  Ears/Nose/Throat/Neck: Denied ear, nose, throat or neck symptoms, hearing loss, nasal discharge, sinus congestion and sore throat.  Cardiovascular: Denied cardiovascular symptoms, arrhythmia,  chest pain/pressure, edema, exercise intolerance, orthopnea and palpitations.  Respiratory: Denied pulmonary symptoms, asthma, pleuritic pain, productive sputum, cough, dyspnea and wheezing.  Gastrointestinal: Denied, gastro-esophageal reflux, melena, nausea and vomiting.  Genitourinary: Denied genitourinary symptoms including symptomatic vaginal discharge, pelvic relaxation issues, and urinary complaints.  Musculoskeletal: Denied musculoskeletal symptoms, stiffness, swelling, muscle weakness and myalgia.  Dermatologic: Denied dermatology symptoms, rash and scar.  Neurologic: Denied neurology symptoms, dizziness, headache, neck pain and syncope.  Psychiatric: Denied psychiatric symptoms, anxiety and depression.  Endocrine: Denied endocrine symptoms including hot flashes and night sweats.   Meds:   Current Outpatient Medications on File Prior to Visit  Medication Sig Dispense Refill   levonorgestrel (MIRENA) 20 MCG/DAY IUD 1 each by Intrauterine route once.     Prenatal Vit-Fe Fumarate-FA (PRENATAL VITAMIN PO) Take by mouth.     ibuprofen (ADVIL) 600 MG tablet Take 1 tablet (600 mg total) by mouth every 6 (six) hours. (Patient not taking: No sig reported) 30 tablet 0   No current facility-administered medications on file prior to visit.      Objective:     Vitals:   07/05/21 1523  BP: 114/75  Pulse: 71   Filed Weights   07/05/21 1523  Weight: 107 lb (48.5 kg)              Physical examination   Pelvic:   Vulva: Normal appearance.  No lesions.  Vagina: No lesions or abnormalities noted.  Support: Normal pelvic support.  Urethra No masses tenderness or scarring.  Meatus Normal size without lesions or prolapse.  Cervix: Normal appearance.  No lesions. IUD strings noted at cervical os.  Anus: Normal exam.  No lesions.  Specifically no fissures are noted.  An external hemorrhoidal tag is present but not swollen/dilated.  Perineum: Normal exam.  No lesions.        Bimanual    Uterus: Normal size.  Non-tender.  Mobile.  AV.  Adnexae: No masses.  Non-tender to palpation.  Cul-de-sac: Negative for abnormality.     Assessment:    [id number] Patient Active Problem List   Diagnosis Date Noted   Indication for care in labor or delivery 05/01/2021   Pregnancy 05/01/2021   Poor fetal growth affecting management of mother in third trimester 04/17/2021   Benign gestational thrombocytopenia in third trimester (HCC) 03/27/2021   Placenta succenturiate lobe affecting fetus 03/27/2021   Fetal cardiac echogenic focus 01/02/2021   Thrombocytopenia, unspecified (HCC) 02/08/2013   LGSIL (low grade squamous intraepithelial dysplasia) 10/03/2012   FHx: Huntington's chorea 09/09/2012     1. Surveillance of previously prescribed intrauterine contraceptive device     IUD correctly positioned -patient's prior negative symptoms resolved.    Plan:            1.  We have specifically discussed anal fissures and hemorrhoids occasionally causing bleeding with bowel movements and causing discomfort.  I have advised use of Citrucel or Metamucil a fiber laxative to keep the stool soft for 3 to 4 weeks.  No specific evidence of fissure was noted at exam today. Orders No orders of the defined types were placed in this encounter.   No orders of the defined types were placed in this encounter.     F/U  Return for Annual Physical.  [name], M.D. 07/05/2021 3:54 PM

## 2021-10-26 DIAGNOSIS — R070 Pain in throat: Secondary | ICD-10-CM | POA: Diagnosis not present

## 2021-10-26 DIAGNOSIS — J111 Influenza due to unidentified influenza virus with other respiratory manifestations: Secondary | ICD-10-CM | POA: Diagnosis not present

## 2021-10-26 DIAGNOSIS — Z20822 Contact with and (suspected) exposure to covid-19: Secondary | ICD-10-CM | POA: Diagnosis not present

## 2022-04-15 IMAGING — US US MFM OB FOLLOW-UP
1 series · 14 of 28 positions shown · non-contrast
Comparison: none

[Series 1: us mfm ob follow-up · 65 acquisitions, 14 frames shown]
[im 3/65]
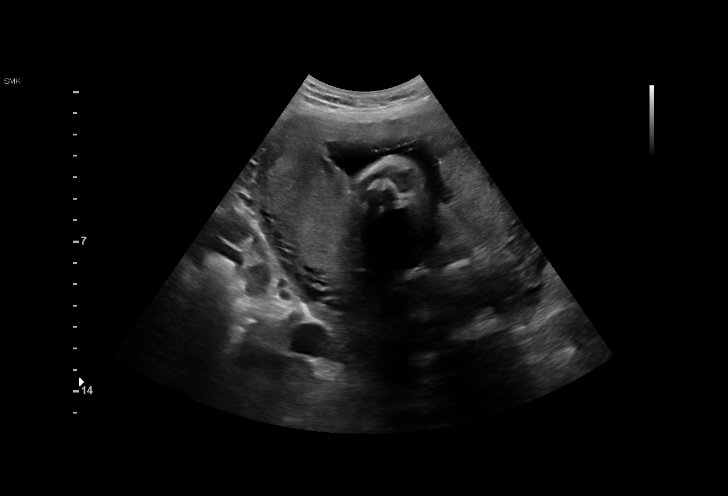
[im 8/65]
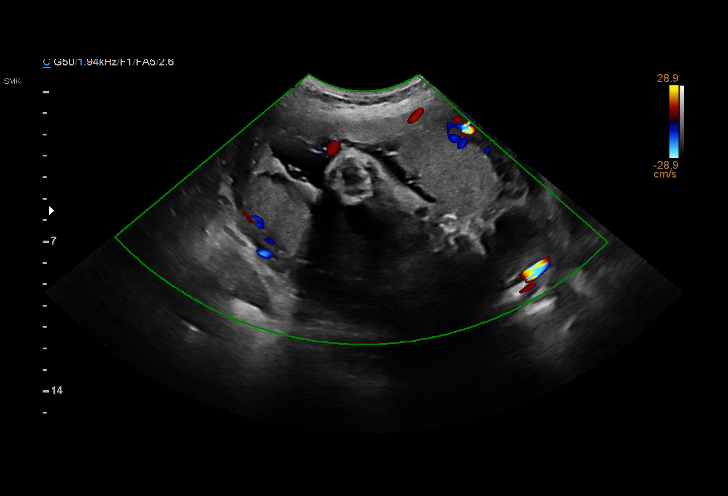
[im 12/65]
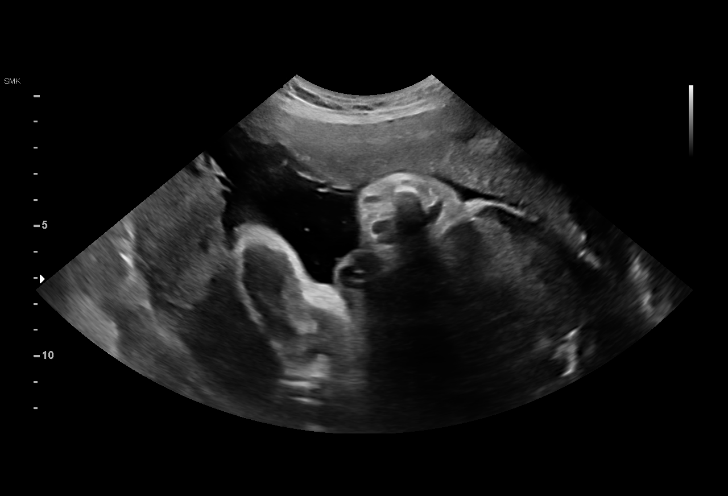
[im 17/65]
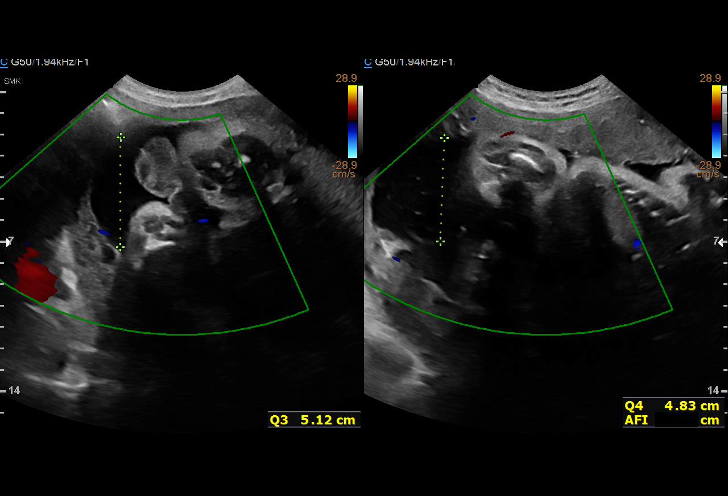
[im 22/65]
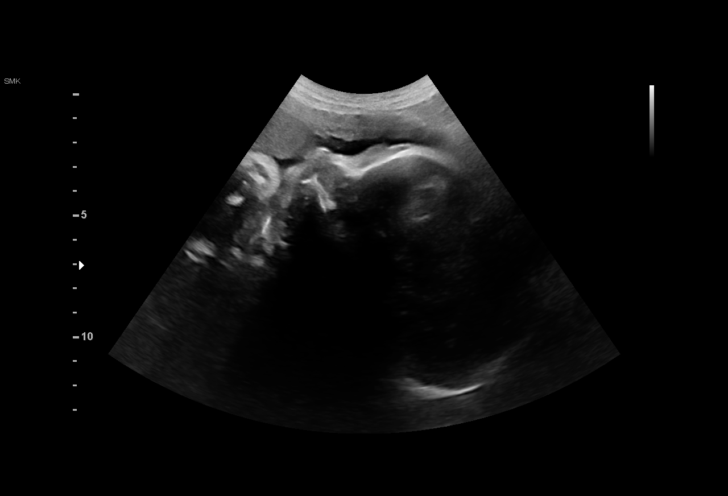
[im 27/65]
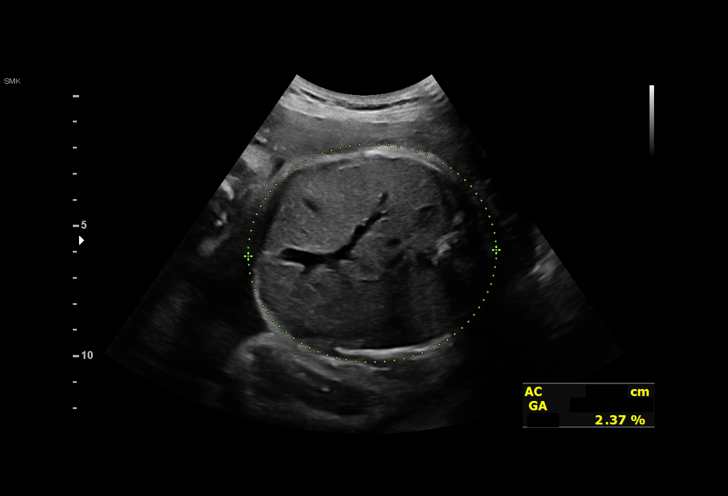
[im 31/65]
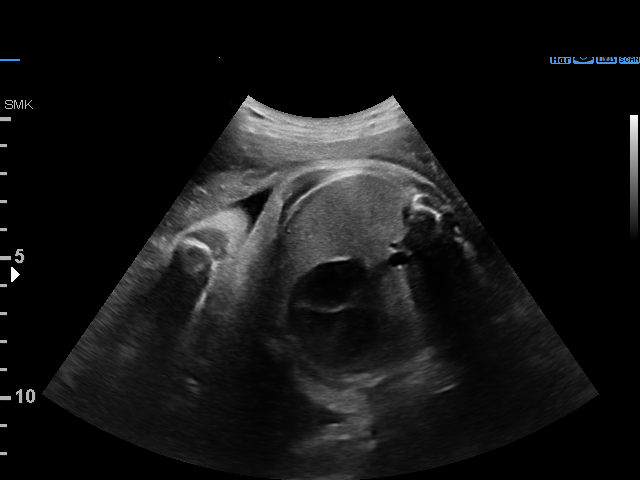
[im 36/65]
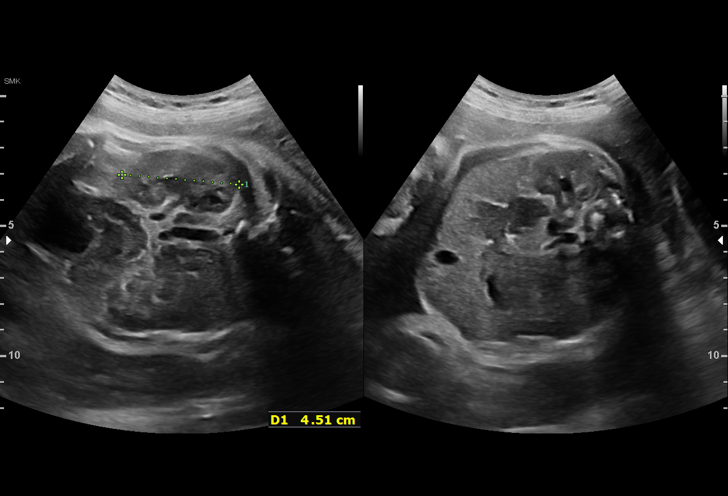
[im 41/65]
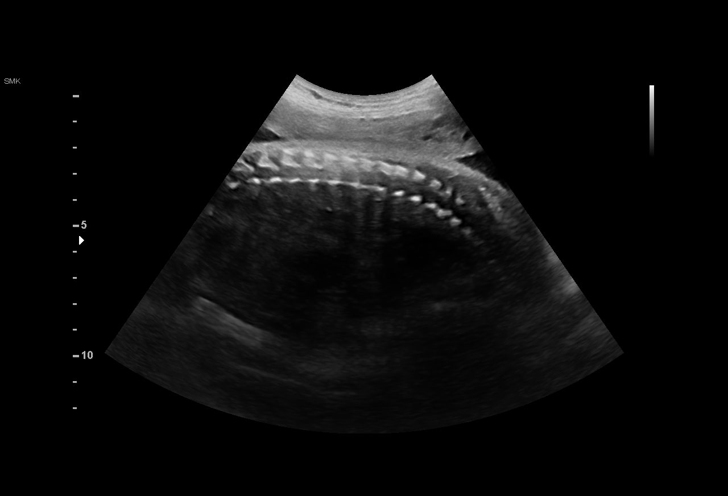
[im 46/65]
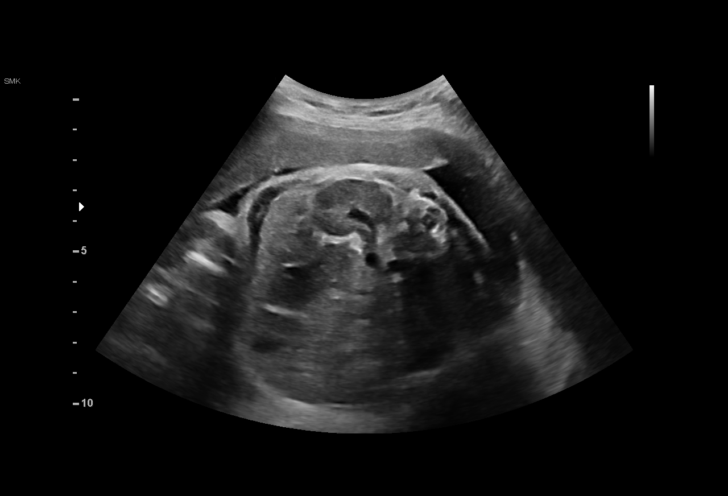
[im 50/65]
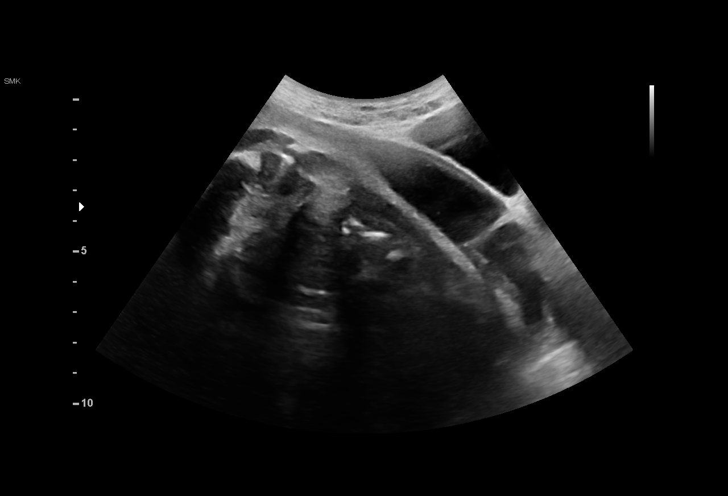
[im 55/65]
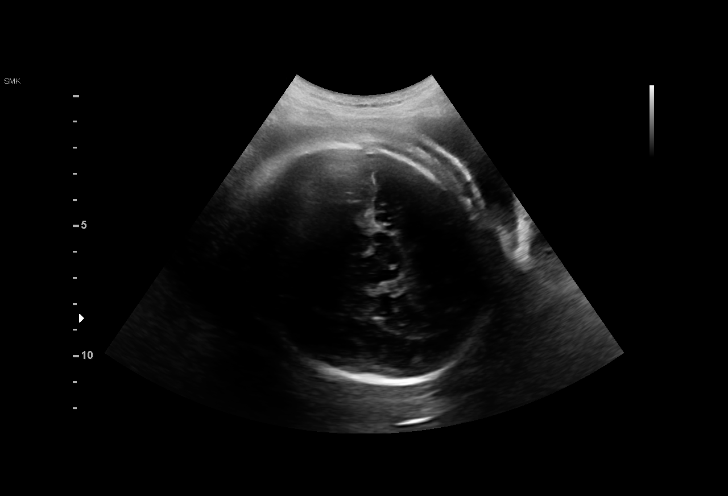
[im 60/65]
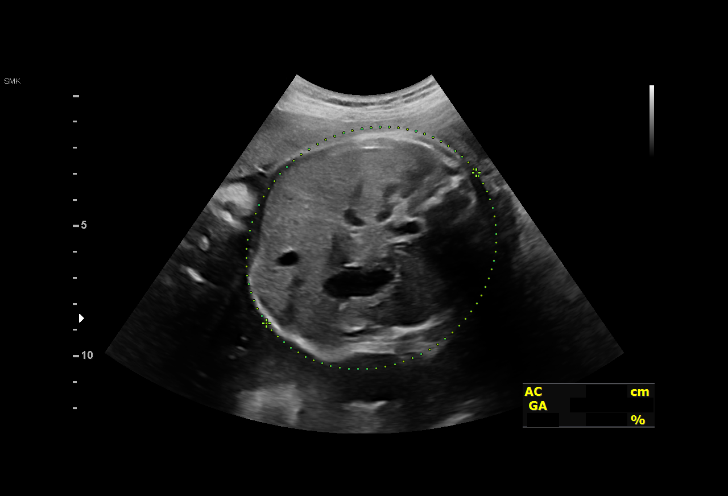
[im 65/65]
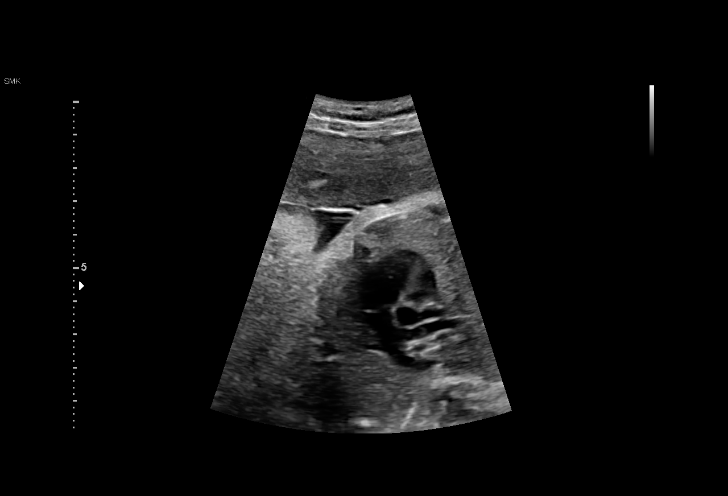

[14 of 28 positions shown; findings below may reference images not displayed]

[REDACTED]

 2  US MFM UA CORD DOPPLER                76820.02    TAMEKIA KAUFMAN

Indications

 35 weeks gestation of pregnancy
 Echogenic intracardiac focus of the heart
 (EIF)
 Family history Huntington's disease
Fetal Evaluation

 Num Of Fetuses:         1
 Fetal Heart Rate(bpm):  129
 Cardiac Activity:       Observed
 Presentation:           Cephalic
 Placenta:               Succenturiate lobe prev seen

 AFI Sum(cm)     %Tile       Largest Pocket(cm)
 21.7            82

 RUQ(cm)       RLQ(cm)       LUQ(cm)        LLQ(cm)

Biophysical Evaluation
 Amniotic F.V:   Pocket => 2 cm             F. Tone:        Observed
 F. Movement:    Observed                   Score:          [DATE]
 F. Breathing:   Observed
Biometry

 BPD:      87.7  mm     G. Age:  35w 3d         65  %    CI:        79.17   %    70 - 86
                                                         FL/HC:      19.8   %    20.1 -
 HC:      311.6  mm     G. Age:  34w 6d         15  %    HC/AC:      1.09        0.93 -
 AC:      287.1  mm     G. Age:  32w 5d          6  %    FL/BPD:     70.2   %    71 - 87
 FL:       61.6  mm     G. Age:  32w 0d        < 1  %    FL/AC:      21.5   %    20 - 24
 HUM:      54.8  mm     G. Age:  31w 6d        < 5  %

 LV:        6.5  mm
 Est. FW:    9433  gm    4 lb 10 oz       6  %
OB History

 Gravidity:    2         Term:   1        Prem:   0        SAB:   0
 TOP:          0       Ectopic:  0        Living: 1
Gestational Age

 U/S Today:     33w 5d                                        EDD:   05/19/21
 Best:          35w 0d     Det. By:  Early Ultrasound         EDD:   05/10/21
                                     (10/24/20)
Doppler - Fetal Vessels

 Umbilical Artery
  S/D     %tile      RI    %tile
  3.41       92    0.71       93

Cervix Uterus Adnexa

 Cervix
 Length:            3.5  cm.

 Right Ovary
 Not visualized.

 Left Ovary
 Not visualized.
Impression

 Patient returned for fetal growth assessment.  On previous
 ultrasound, succenturiate lobe was detected.  On today's
 ultrasound, amniotic fluid is normal good fetal activity seen.
 The estimated fetal weight is at the 6th percentile.  Abdominal
 circumference measurement is at the 6th percentile.
 Umbilical artery Doppler showed normal forward diastolic
 flow.  Antenatal testing is reassuring.  BPP [DATE].
 Placenta could not be evaluated today because of fetal
 position.
 I explained the finding of fetal growth restriction that is difficult
 to differentiate from a constitutionally small fetus.  Her
 previous child weighed 2,974 g at birth.
 Patient does not have gestational diabetes and her blood
 pressures have been normal at prenatal visits.  Blood
 pressure today at her office is 110/83 mmHg.
 I discussed the ultrasound protocol of monitoring fetal growth
 restriction.
Recommendations

 -NST at your office next week and then weekly BPP and UA
 Doppler at our office till delivery.
 -We will discuss timing of delivery after next fetal growth
 assessment in 3 weeks.
                 Cancio, Breona

## 2022-08-30 ENCOUNTER — Encounter: Payer: Self-pay | Admitting: Obstetrics and Gynecology

## 2022-11-14 DIAGNOSIS — J111 Influenza due to unidentified influenza virus with other respiratory manifestations: Secondary | ICD-10-CM | POA: Diagnosis not present

## 2022-11-14 DIAGNOSIS — R059 Cough, unspecified: Secondary | ICD-10-CM | POA: Diagnosis not present

## 2022-11-14 DIAGNOSIS — Z20822 Contact with and (suspected) exposure to covid-19: Secondary | ICD-10-CM | POA: Diagnosis not present

## 2023-11-27 DIAGNOSIS — J0100 Acute maxillary sinusitis, unspecified: Secondary | ICD-10-CM | POA: Diagnosis not present

## 2023-11-27 DIAGNOSIS — R070 Pain in throat: Secondary | ICD-10-CM | POA: Diagnosis not present

## 2023-11-27 DIAGNOSIS — Z20822 Contact with and (suspected) exposure to covid-19: Secondary | ICD-10-CM | POA: Diagnosis not present

## 2024-05-05 ENCOUNTER — Other Ambulatory Visit (HOSPITAL_COMMUNITY)
Admission: RE | Admit: 2024-05-05 | Discharge: 2024-05-05 | Disposition: A | Discharge status: Home / Self Care | Source: Ambulatory Visit | Attending: Obstetrics and Gynecology | Admitting: Obstetrics and Gynecology

## 2024-05-05 ENCOUNTER — Ambulatory Visit (INDEPENDENT_AMBULATORY_CARE_PROVIDER_SITE_OTHER): Payer: Self-pay | Admitting: Obstetrics and Gynecology

## 2024-05-05 ENCOUNTER — Encounter: Payer: Self-pay | Admitting: Obstetrics and Gynecology

## 2024-05-05 VITALS — BP 116/80 | HR 83 | Ht 62.0 in | Wt 94.8 lb

## 2024-05-05 DIAGNOSIS — Z30431 Encounter for routine checking of intrauterine contraceptive device: Secondary | ICD-10-CM

## 2024-05-05 DIAGNOSIS — Z113 Encounter for screening for infections with a predominantly sexual mode of transmission: Secondary | ICD-10-CM | POA: Diagnosis not present

## 2024-05-05 DIAGNOSIS — Z124 Encounter for screening for malignant neoplasm of cervix: Secondary | ICD-10-CM | POA: Insufficient documentation

## 2024-05-05 DIAGNOSIS — Z01419 Encounter for gynecological examination (general) (routine) without abnormal findings: Secondary | ICD-10-CM | POA: Diagnosis not present

## 2024-05-05 DIAGNOSIS — Z131 Encounter for screening for diabetes mellitus: Secondary | ICD-10-CM | POA: Diagnosis not present

## 2024-05-05 DIAGNOSIS — Z13220 Encounter for screening for lipoid disorders: Secondary | ICD-10-CM | POA: Diagnosis not present

## 2024-05-05 DIAGNOSIS — Z130 Encounter for screening for diseases of the blood and blood-forming organs and certain disorders involving the immune mechanism: Secondary | ICD-10-CM | POA: Diagnosis not present

## 2024-05-05 DIAGNOSIS — Z1329 Encounter for screening for other suspected endocrine disorder: Secondary | ICD-10-CM | POA: Diagnosis not present

## 2024-05-05 NOTE — Addendum Note (Signed)
 Addended by: Vale Garrison on: 05/05/2024 01:47 PM   Modules accepted: Orders

## 2024-05-05 NOTE — Progress Notes (Signed)
 HPI:      [name] is a 38 y.o. [phone] who LMP was No LMP recorded. (Menstrual status: IUD).  Subjective:   She presents today for her annual examination.  She has no complaints.  She states that she occasionally has a menstrual period but they are not regular because of her Mirena  IUD.  She is requesting STD testing just as part of her annual exam.  She has no concerns. Her last Pap smear showed LGSIL and she has not followed up since that time.    Hx: The following portions of the patient's history were reviewed and updated as appropriate:             She  has a past medical history of Infection, Infection, No pertinent past medical history, and Vaginal Pap smear, abnormal. She does not have any pertinent problems on file. She  has a past surgical history that includes No past surgeries and denies. Her family history includes Alcohol abuse in her father; Asthma in her paternal uncle; Breast cancer in her paternal aunt; Hypertension in her father and mother; Other in an other family member. She  reports that she has been smoking cigars and cigarettes. She has a 3 pack-year smoking history. She has never used smokeless tobacco. She reports that she does not currently use alcohol. She reports that she does not currently use drugs after having used the following drugs: Marijuana. Frequency: 1.00 time per week. She has a current medication list which includes the following prescription(s): levonorgestrel . She has no known allergies.       Review of Systems:  Review of Systems  Constitutional: Denied constitutional symptoms, night sweats, recent illness, fatigue, fever, insomnia and weight loss.  Eyes: Denied eye symptoms, eye pain, photophobia, vision change and visual disturbance.  Ears/Nose/Throat/Neck: Denied ear, nose, throat or neck symptoms, hearing loss, nasal discharge, sinus congestion and sore throat.  Cardiovascular: Denied cardiovascular symptoms, arrhythmia, chest  pain/pressure, edema, exercise intolerance, orthopnea and palpitations.  Respiratory: Denied pulmonary symptoms, asthma, pleuritic pain, productive sputum, cough, dyspnea and wheezing.  Gastrointestinal: Denied, gastro-esophageal reflux, melena, nausea and vomiting.  Genitourinary: Denied genitourinary symptoms including symptomatic vaginal discharge, pelvic relaxation issues, and urinary complaints.  Musculoskeletal: Denied musculoskeletal symptoms, stiffness, swelling, muscle weakness and myalgia.  Dermatologic: Denied dermatology symptoms, rash and scar.  Neurologic: Denied neurology symptoms, dizziness, headache, neck pain and syncope.  Psychiatric: Denied psychiatric symptoms, anxiety and depression.  Endocrine: Denied endocrine symptoms including hot flashes and night sweats.   Meds:   Current Outpatient Medications on File Prior to Visit  Medication Sig Dispense Refill   levonorgestrel  (MIRENA ) 20 MCG/DAY IUD 1 each by Intrauterine route once.     No current facility-administered medications on file prior to visit.     Objective:     Vitals:   05/05/24 1321  BP: 116/80  Pulse: 83    Filed Weights   05/05/24 1321  Weight: 94 lb 12.8 oz (43 kg)              Physical examination General NAD, Conversant  HEENT Atraumatic; Op clear with mmm.  Normo-cephalic.  Anicteric sclerae  Thyroid/Neck Smooth without nodularity or enlargement. Normal ROM.  Neck Supple.  Skin No rashes, lesions or ulceration. Normal palpated skin turgor. No nodularity.  Breasts: No masses or discharge.  Symmetric.  No axillary adenopathy.  Lungs: Clear to auscultation.No rales or wheezes. Normal Respiratory effort, no retractions.  Heart: NSR.  No murmurs or rubs appreciated. No peripheral  edema  Abdomen: Soft.  Non-tender.  No masses.  No HSM. No hernia  Extremities: Moves all appropriately.  Normal ROM for age. No lymphadenopathy.  Neuro: Oriented to PPT.  Normal mood. Normal affect.     Pelvic:    Vulva: Normal appearance.  No lesions.  Vagina: No lesions or abnormalities noted.  Support: Normal pelvic support.  Urethra No masses tenderness or scarring.  Meatus Normal size without lesions or prolapse.  Cervix: Normal appearance.  No lesions.  IUD strings noted  Anus: Normal exam.  No lesions.  Perineum: Normal exam.  No lesions.        Bimanual   Uterus: Normal size.  Non-tender.  Mobile.  AV.  Adnexae: No masses.  Non-tender to palpation.  Cul-de-sac: Negative for abnormality.     Assessment:    [id number] Patient Active Problem List   Diagnosis Date Noted   Indication for care in labor or delivery 05/01/2021   Pregnancy 05/01/2021   Poor fetal growth affecting management of mother in third trimester 04/17/2021   Benign gestational thrombocytopenia in third trimester (HCC) 03/27/2021   Placenta succenturiate lobe affecting fetus 03/27/2021   Fetal cardiac echogenic focus 01/02/2021   Thrombocytopenia, unspecified (HCC) 02/08/2013   LGSIL (low grade squamous intraepithelial dysplasia) 10/03/2012   FHx: Huntington's chorea 09/09/2012     1. Well woman exam with routine gynecological exam   2. Cervical cancer screening   3. Screening for STD (sexually transmitted disease)   4. Encounter for routine checking of intrauterine contraceptive device (IUD)     Normal exam -history of abnormal Pap   Plan:            1.  Basic Screening Recommendations The basic screening recommendations for asymptomatic women were discussed with the patient during her visit.  The age-appropriate recommendations were discussed with her and the rational for the tests reviewed.  When I am informed by the patient that another primary care physician has previously obtained the age-appropriate tests and they are up-to-date, only outstanding tests are ordered and referrals given as necessary.  Abnormal results of tests will be discussed with her when all of her results are completed.  Routine  preventative health maintenance measures emphasized: Exercise/Diet/Weight control, Tobacco Warnings, Alcohol/Substance use risks and Stress Management Pap performed -STD testing per patient request including blood work 2.  Advised patient to return for colposcopy if Pap was abnormal-stressed the importance of follow-up after abnormal Paps.  Orders Orders Placed This Encounter  Procedures   Hepatitis B surface antigen   Hepatitis C antibody   HIV Antibody (routine testing w rflx)   RPR    No orders of the defined types were placed in this encounter.       F/U  Return in about 1 week (around 05/12/2024) for Annual Physical, We will contact her with any abnormal test results.  Delice Felt, M.D. 05/05/2024 1:42 PM

## 2024-05-05 NOTE — Progress Notes (Signed)
 Patients presents for annual exam today. She states doing well with current IUD, reports occasional spotting. Due for pap smear, ordered. Annual labs are ordered. STD screening requested and  ordered. She states no other questions or concerns at this time.

## 2024-05-07 LAB — BASIC METABOLIC PANEL WITH GFR
BUN/Creatinine Ratio: 13 (ref 9–23)
BUN: 10 mg/dL (ref 6–20)
CO2: 23 mmol/L (ref 20–29)
Calcium: 8.7 mg/dL (ref 8.7–10.2)
Chloride: 103 mmol/L (ref 96–106)
Creatinine, Ser: 0.78 mg/dL (ref 0.57–1.00)
Glucose: 77 mg/dL (ref 70–99)
Potassium: 3.8 mmol/L (ref 3.5–5.2)
Sodium: 141 mmol/L (ref 134–144)
eGFR: 101 mL/min/{1.73_m2} (ref >59)

## 2024-05-07 LAB — LIPID PANEL
Chol/HDL Ratio: 3.4 ratio (ref 0.0–4.4)
Cholesterol, Total: 139 mg/dL (ref 100–199)
HDL: 41 mg/dL (ref >39)
LDL Chol Calc (NIH): 83 mg/dL (ref 0–99)
Triglycerides: 76 mg/dL (ref 0–149)
VLDL Cholesterol Cal: 15 mg/dL (ref 5–40)

## 2024-05-07 LAB — CBC
Hematocrit: 42.4 % (ref 34.0–46.6)
Hemoglobin: 14 g/dL (ref 11.1–15.9)
MCH: 29.3 pg (ref 26.6–33.0)
MCHC: 33 g/dL (ref 31.5–35.7)
MCV: 89 fL (ref 79–97)
Platelets: 152 10*3/uL (ref 150–450)
RBC: 4.78 x10E6/uL (ref 3.77–5.28)
RDW: 13.2 % (ref 11.7–15.4)
WBC: 8 10*3/uL (ref 3.4–10.8)

## 2024-05-07 LAB — HIV ANTIBODY (ROUTINE TESTING W REFLEX): HIV Screen 4th Generation wRfx: NONREACTIVE

## 2024-05-07 LAB — HEMOGLOBIN A1C
Est. average glucose Bld gHb Est-mCnc: 100 mg/dL
Hgb A1c MFr Bld: 5.1 % (ref 4.8–5.6)

## 2024-05-07 LAB — TSH: TSH: 2.3 u[IU]/mL (ref 0.450–4.500)

## 2024-05-07 LAB — HEPATITIS B SURFACE ANTIGEN: Hepatitis B Surface Ag: NEGATIVE

## 2024-05-07 LAB — RPR: RPR Ser Ql: NONREACTIVE

## 2024-05-07 LAB — HEPATITIS C ANTIBODY: Hep C Virus Ab: NONREACTIVE

## 2024-05-11 LAB — CYTOLOGY - PAP
Chlamydia: NEGATIVE
Comment: NEGATIVE
Comment: NEGATIVE
Comment: NEGATIVE
Comment: NORMAL
Diagnosis: UNDETERMINED — AB
High risk HPV: NEGATIVE
Neisseria Gonorrhea: NEGATIVE
Trichomonas: NEGATIVE

## 2024-05-12 ENCOUNTER — Ambulatory Visit: Payer: Self-pay
